# Patient Record
Sex: Female | Born: 1975 | Race: Black or African American | Hispanic: No | Smoking: Never smoker
Health system: Southern US, Community
[De-identification: ages and names within clinical notes are randomized; demographics above are authoritative.]

## PROBLEM LIST (undated history)

## (undated) DIAGNOSIS — Z789 Other specified health status: Secondary | ICD-10-CM

## (undated) HISTORY — DX: Other specified health status: Z78.9

---

## 1999-05-09 ENCOUNTER — Emergency Department (HOSPITAL_COMMUNITY): Admission: EM | Admit: 1999-05-09 | Discharge: 1999-05-09 | Payer: Self-pay | Admitting: Emergency Medicine

## 1999-08-28 ENCOUNTER — Other Ambulatory Visit: Admission: RE | Admit: 1999-08-28 | Discharge: 1999-08-28 | Payer: Self-pay | Admitting: Obstetrics

## 2000-02-27 ENCOUNTER — Inpatient Hospital Stay: Admission: AD | Admit: 2000-02-27 | Discharge: 2000-02-27 | Payer: Self-pay | Admitting: Obstetrics

## 2000-03-16 ENCOUNTER — Inpatient Hospital Stay (HOSPITAL_COMMUNITY): Admission: AD | Admit: 2000-03-16 | Discharge: 2000-03-18 | Payer: Self-pay | Admitting: Obstetrics

## 2000-03-31 ENCOUNTER — Encounter: Admission: RE | Admit: 2000-03-31 | Discharge: 2000-06-29 | Payer: Self-pay | Admitting: Obstetrics

## 2000-11-02 ENCOUNTER — Emergency Department (HOSPITAL_COMMUNITY): Admission: EM | Admit: 2000-11-02 | Discharge: 2000-11-02 | Payer: Self-pay | Admitting: *Deleted

## 2000-11-09 ENCOUNTER — Other Ambulatory Visit: Admission: RE | Admit: 2000-11-09 | Discharge: 2000-11-09 | Payer: Self-pay | Admitting: Obstetrics

## 2001-05-27 ENCOUNTER — Inpatient Hospital Stay (HOSPITAL_COMMUNITY): Admission: AD | Admit: 2001-05-27 | Discharge: 2001-05-27 | Payer: Self-pay | Admitting: Obstetrics

## 2001-07-05 ENCOUNTER — Inpatient Hospital Stay (HOSPITAL_COMMUNITY): Admission: AD | Admit: 2001-07-05 | Discharge: 2001-07-08 | Payer: Self-pay | Admitting: Obstetrics

## 2001-07-05 ENCOUNTER — Encounter (INDEPENDENT_AMBULATORY_CARE_PROVIDER_SITE_OTHER): Payer: Self-pay | Admitting: Specialist

## 2002-07-14 ENCOUNTER — Emergency Department (HOSPITAL_COMMUNITY): Admission: EM | Admit: 2002-07-14 | Discharge: 2002-07-14 | Payer: Self-pay | Admitting: Emergency Medicine

## 2004-01-21 ENCOUNTER — Emergency Department (HOSPITAL_COMMUNITY): Admission: EM | Admit: 2004-01-21 | Discharge: 2004-01-21 | Payer: Self-pay | Admitting: Emergency Medicine

## 2004-03-05 ENCOUNTER — Emergency Department (HOSPITAL_COMMUNITY): Admission: EM | Admit: 2004-03-05 | Discharge: 2004-03-06 | Payer: Self-pay | Admitting: Family Medicine

## 2004-04-01 ENCOUNTER — Emergency Department (HOSPITAL_COMMUNITY): Admission: EM | Admit: 2004-04-01 | Discharge: 2004-04-01 | Payer: Self-pay | Admitting: Family Medicine

## 2004-07-17 ENCOUNTER — Emergency Department (HOSPITAL_COMMUNITY): Admission: EM | Admit: 2004-07-17 | Discharge: 2004-07-17 | Payer: Self-pay | Admitting: Family Medicine

## 2005-01-07 ENCOUNTER — Emergency Department (HOSPITAL_COMMUNITY): Admission: EM | Admit: 2005-01-07 | Discharge: 2005-01-07 | Payer: Self-pay | Admitting: Emergency Medicine

## 2007-03-31 ENCOUNTER — Emergency Department (HOSPITAL_COMMUNITY): Admission: EM | Admit: 2007-03-31 | Discharge: 2007-03-31 | Payer: Self-pay | Admitting: Emergency Medicine

## 2007-05-03 ENCOUNTER — Emergency Department (HOSPITAL_COMMUNITY): Admission: EM | Admit: 2007-05-03 | Discharge: 2007-05-04 | Payer: Self-pay | Admitting: Emergency Medicine

## 2007-12-02 ENCOUNTER — Emergency Department (HOSPITAL_COMMUNITY): Admission: EM | Admit: 2007-12-02 | Discharge: 2007-12-02 | Payer: Self-pay | Admitting: Emergency Medicine

## 2009-06-01 ENCOUNTER — Emergency Department (HOSPITAL_COMMUNITY): Admission: EM | Admit: 2009-06-01 | Discharge: 2009-06-01 | Payer: Self-pay | Admitting: Emergency Medicine

## 2009-12-13 ENCOUNTER — Emergency Department (HOSPITAL_COMMUNITY): Admission: EM | Admit: 2009-12-13 | Discharge: 2009-12-13 | Payer: Self-pay | Admitting: Family Medicine

## 2009-12-20 ENCOUNTER — Emergency Department (HOSPITAL_COMMUNITY): Admission: EM | Admit: 2009-12-20 | Discharge: 2009-12-20 | Payer: Self-pay | Admitting: Family Medicine

## 2011-02-28 NOTE — H&P (Signed)
[facility]  Patient:    ARRA, CONNAUGHTON Visit Number: [id number] MRN: [id number]          Service Type: OBS Location: 910B 9159 01 Attending Physician:  Venita Sheffield Dictated by:   Kathreen Cosier, M.D. Admit Date:  07/05/2001                           History and Physical  HISTORY:                      The patient is a 35 year old, gravida 2, para 1-0-0-1, [facility] July 01, 2001 who was admitted for induction. She had a positive GBS which was treated with Cleocin. On admission, her cervix was 4 cm, 70%, vertex, and -2. The membranes were ruptured artificially and the fluid was clear. She was started on Pitocin stimulation and by 4:55 p.m. she was still at 4 cm, 90%, vertex, and -1. By 7:15 p.m. she was still 4 cm, 90%, vertex -1 and having late decelerations which did not stop with O2 administration, position change, and even when the Pitocin was discontinued, she still continued having late decelerations. She was given terbutaline 0.25 mg subcutaneously while awaiting the OR.  PHYSICAL EXAMINATION:  GENERAL:                      Well-developed female in labor.  HEENT:                        Negative.  LUNGS:                        Clear.  HEART:                        Regular rhythm, no murmurs or gallops.  ABDOMEN:                      Term size uterus. Estimated fetal weight 7 pounds 6 ounces. Fetal heart 159.  EXTREMITIES:                  Negative. Dictated by:   Kathreen Cosier, M.D. Attending Physician:  Venita Sheffield DD:  07/05/01 TD:  07/05/01 Job: [id number]

## 2011-02-28 NOTE — Op Note (Signed)
[facility]  Patient:    Bethany Reid, Bethany Reid Visit Number: [id number] MRN: [id number]          Service Type: OBS Location: 910B 9159 01 Attending Physician:  Venita Sheffield Dictated by:   Kathreen Cosier, M.D. Proc. Date: 07/05/01 Admit Date:  07/05/2001                             Operative Report  PREOPERATIVE DIAGNOSIS:       Nonreassuring fetal heart rate tracing.  POSTOPERATIVE DIAGNOSIS:  PROCEDURE:                    Primary low transverse cesarean section and                               tubal ligation.  SURGEON:                      Kathreen Cosier, M.D.  ANESTHESIA:                   Epidural.  DESCRIPTION OF PROCEDURE:     The patient was placed on the operating table in supine position. Abdomen was prepped and draped. The bladder was emptied with a Foley catheter. A transverse suprapubic incision was made and carried down to the rectus fascia. The fascia was cleanly incised the length of the incision. The rectus muscles were retracted laterally. The peritoneum was incised longitudinally. A transverse incision was made through the visceral peritoneum above the bladder and the bladder mobilized inferiorly. A transverse lower uterine incision was made and the fluid was clear. The patient delivered from the OP position, a female, Apgar 8 and 9 weighing 7 pounds 13 ounces. The team was in attendance. The placenta was anterior and removed manually and sent to pathology. The uterine cavity was cleaned with dry laps.  The uterine incision was closed in one layer with continuous suture of #1 chromic. Hemostasis was satisfactory. Bladder flap was reattached with 2-0 chromic. The uterus was well contracted. Tubes and ovaries were normal. The right tube was grasped in the mid portion with the Babcock clamp. A 0 plain suture was placed in the mesosalpinx below the portion of the tube and then clamped. Approximately 1  inch of tube was transected. Procedure done in a similar fashion on the other side. Probes were removed. The abdomen was closed in layers; the peritoneum with continuous suture of 0 chromic, the fascia with continuous suture of 0 Dexon, and the skin closed with subcuticular suture of 3-0 plain. The patient tolerated the procedure well and was taken to the recovery room in good condition. Dictated by:   Kathreen Cosier, M.D. Attending Physician:  Venita Sheffield DD:  07/05/01 TD:  07/05/01 Job: [id number]

## 2011-02-28 NOTE — Discharge Summary (Signed)
[facility]  Patient:    Bethany Reid, JOKERST Visit Number: [id number] MRN: [id number]          Service Type: OBS Location: 910A 9108 01 Attending Physician:  Venita Sheffield Dictated by:   Kathreen Cosier, M.D. Admit Date:  07/05/2001 Discharge Date: 07/08/2001                             Discharge Summary  HISTORY OF PRESENT ILLNESS:  The patient is a 35 year old Gravida 2, Para 1-0-0-1, whose EDC was July 01, 2001.  HOSPITAL COURSE:  She was admitted for induction.  Her cervix was 4 cm, 70% at -2 station on admission.  The membranes were ruptured artificially, the fluid was clear.  She had a positive GBS, she is allergic to PENICILLIN, started on Cleocin 900 mg IV every 8 hours.  The estimated fetal weight was 7.6 ounces. The patient developed persistent late decelerations, which did not resolve with position change or oxygen administration and underwent a primary low transverse cesarean section and tubal ligation.  She had a female, Apgar 8/9, weighing 7 pounds 13 ounces from the OP position.  The placenta was sent to pathology.  Postoperatively, she did well, her hemoglobin was 10.4.  She was discharged home on the third postoperative day, ambulatory, on a regular diet on Tylox 1 p.o. q.4h., to see me in six weeks.  DISCHARGE DIAGNOSIS:  Status post primary low-transverse cesarean section for a nonreassuring fetal heart rate tracing.  Multiparity, tubal ligation performed. Dictated by:   Kathreen Cosier, M.D. Attending Physician:  Venita Sheffield DD:  07/08/01 TD:  07/08/01 Job: [id number]

## 2011-12-24 ENCOUNTER — Other Ambulatory Visit: Payer: Self-pay | Admitting: Nurse Practitioner

## 2011-12-24 DIAGNOSIS — N926 Irregular menstruation, unspecified: Secondary | ICD-10-CM

## 2011-12-31 ENCOUNTER — Ambulatory Visit
Admission: RE | Admit: 2011-12-31 | Discharge: 2011-12-31 | Disposition: A | Discharge status: Home / Self Care | Payer: 59 | Source: Ambulatory Visit | Attending: Nurse Practitioner | Admitting: Nurse Practitioner

## 2011-12-31 DIAGNOSIS — N926 Irregular menstruation, unspecified: Secondary | ICD-10-CM

## 2013-09-18 ENCOUNTER — Encounter (HOSPITAL_COMMUNITY): Payer: Self-pay | Admitting: Emergency Medicine

## 2013-09-18 ENCOUNTER — Emergency Department (HOSPITAL_COMMUNITY)
Admission: EM | Admit: 2013-09-18 | Discharge: 2013-09-18 | Disposition: A | Discharge status: Home / Self Care | Payer: 59 | Attending: Emergency Medicine | Admitting: Emergency Medicine

## 2013-09-18 DIAGNOSIS — R509 Fever, unspecified: Secondary | ICD-10-CM | POA: Insufficient documentation

## 2013-09-18 DIAGNOSIS — R51 Headache: Secondary | ICD-10-CM | POA: Insufficient documentation

## 2013-09-18 DIAGNOSIS — Z880 Allergy status to penicillin: Secondary | ICD-10-CM | POA: Insufficient documentation

## 2013-09-18 DIAGNOSIS — N12 Tubulo-interstitial nephritis, not specified as acute or chronic: Secondary | ICD-10-CM | POA: Insufficient documentation

## 2013-09-18 DIAGNOSIS — Z3202 Encounter for pregnancy test, result negative: Secondary | ICD-10-CM | POA: Insufficient documentation

## 2013-09-18 DIAGNOSIS — R110 Nausea: Secondary | ICD-10-CM | POA: Insufficient documentation

## 2013-09-18 LAB — BASIC METABOLIC PANEL
BUN: 8 mg/dL (ref 6–23)
CO2: 23 mEq/L (ref 19–32)
Chloride: 104 mEq/L (ref 96–112)
Creatinine, Ser: 0.83 mg/dL (ref 0.50–1.10)

## 2013-09-18 LAB — URINALYSIS, ROUTINE W REFLEX MICROSCOPIC
Glucose, UA: NEGATIVE mg/dL
pH: 6.5 (ref 5.0–8.0)

## 2013-09-18 LAB — CBC WITH DIFFERENTIAL/PLATELET
HCT: 33.2 % — ABNORMAL LOW (ref 36.0–46.0)
Hemoglobin: 11.3 g/dL — ABNORMAL LOW (ref 12.0–15.0)
Lymphocytes Relative: 8 % — ABNORMAL LOW (ref 12–46)
Monocytes Absolute: 0.9 10*3/uL (ref 0.1–1.0)
Monocytes Relative: 8 % (ref 3–12)
Neutro Abs: 9.3 10*3/uL — ABNORMAL HIGH (ref 1.7–7.7)
WBC: 11.1 10*3/uL — ABNORMAL HIGH (ref 4.0–10.5)

## 2013-09-18 LAB — URINE MICROSCOPIC-ADD ON

## 2013-09-18 LAB — POCT PREGNANCY, URINE: Preg Test, Ur: NEGATIVE

## 2013-09-18 MED ORDER — SODIUM CHLORIDE 0.9 % IV BOLUS (SEPSIS)
1000.0000 mL | Freq: Once | INTRAVENOUS | Status: AC
  Administered 2013-09-18: 1000 mL via INTRAVENOUS

## 2013-09-18 MED ORDER — HYDROCODONE-ACETAMINOPHEN 5-325 MG PO TABS: 2.0000 | ORAL_TABLET | ORAL | Status: DC | PRN

## 2013-09-18 MED ORDER — ONDANSETRON HCL 4 MG/2ML IJ SOLN
4.0000 mg | Freq: Once | INTRAMUSCULAR | Status: AC
  Administered 2013-09-18: 4 mg via INTRAVENOUS
  Filled 2013-09-18: qty 2

## 2013-09-18 MED ORDER — ACETAMINOPHEN 325 MG PO TABS
650.0000 mg | ORAL_TABLET | Freq: Once | ORAL | Status: AC
  Administered 2013-09-18: 650 mg via ORAL
  Filled 2013-09-18: qty 2

## 2013-09-18 MED ORDER — ACETAMINOPHEN 650 MG RE SUPP
650.0000 mg | Freq: Once | RECTAL | Status: DC
  Filled 2013-09-18: qty 1

## 2013-09-18 MED ORDER — CIPROFLOXACIN HCL 500 MG PO TABS: 500.0000 mg | ORAL_TABLET | Freq: Two times a day (BID) | ORAL | Status: DC

## 2013-09-18 MED ORDER — ONDANSETRON 4 MG PO TBDP: 4.0000 mg | ORAL_TABLET | Freq: Three times a day (TID) | ORAL | Status: DC | PRN

## 2013-09-18 MED ORDER — CIPROFLOXACIN IN D5W 400 MG/200ML IV SOLN
400.0000 mg | Freq: Once | INTRAVENOUS | Status: AC
  Administered 2013-09-18: 400 mg via INTRAVENOUS
  Filled 2013-09-18: qty 200

## 2013-09-18 MED ORDER — MORPHINE SULFATE 4 MG/ML IJ SOLN
4.0000 mg | Freq: Once | INTRAMUSCULAR | Status: AC
  Administered 2013-09-18: 4 mg via INTRAVENOUS
  Filled 2013-09-18: qty 1

## 2013-09-18 NOTE — ED Notes (Signed)
Patient will let me know when she has to urinate

## 2013-09-18 NOTE — ED Provider Notes (Signed)
CSN: [id number]     Arrival date & time 09/18/13  1478 History   First MD Initiated Contact with Patient 09/18/13 1004     Chief Complaint  Patient presents with  . Headache  . Abdominal Pain  . Flank Pain  . Fever   (Consider location/radiation/quality/duration/timing/severity/associated sxs/prior Treatment) HPI Comments: Patient is a 37 year old female with no significant past medical history who presents with left flank pain. The pain is located in her left flank and radiates to her back. The pain is described as aching and severe. The pain started gradually and progressively worsened since the onset. No alleviating/aggravating factors. The patient has tried nothing for symptoms without relief. Associated symptoms include headache, nausea and fever. Patient denies vomiting, diarrhea, chest pain, SOB, dysuria, constipation, abnormal vaginal bleeding/discharge. LMP last week.      History reviewed. No pertinent past medical history. History reviewed. No pertinent past surgical history. No family history on file. History  Substance Use Topics  . Smoking status: Not on file  . Smokeless tobacco: Not on file  . Alcohol Use: Not on file   OB History   Grav Para Term Preterm Abortions TAB SAB Ect Mult Living                 Review of Systems  Constitutional: Positive for fever. Negative for chills and fatigue.  HENT: Negative for trouble swallowing.   Eyes: Negative for visual disturbance.  Respiratory: Negative for shortness of breath.   Cardiovascular: Negative for chest pain and palpitations.  Gastrointestinal: Positive for abdominal pain. Negative for nausea, vomiting and diarrhea.  Genitourinary: Positive for flank pain. Negative for dysuria and difficulty urinating.  Musculoskeletal: Negative for arthralgias and neck pain.  Skin: Negative for color change.  Neurological: Negative for dizziness and weakness.  Psychiatric/Behavioral: Negative for dysphoric mood.     Allergies  Penicillins  Home Medications   Current Outpatient Rx  Name  Route  Sig  Dispense  Refill  . ibuprofen (ADVIL,MOTRIN) 200 MG tablet   Oral   Take 1,200 mg by mouth every 6 (six) hours as needed.          BP 118/79  Pulse 123  Temp(Src) 102.8 F (39.3 C) (Oral)  Resp 14  SpO2 98% Physical Exam  Nursing note and vitals reviewed. Constitutional: She is oriented to person, place, and time. She appears well-developed and well-nourished. No distress.  HENT:  Head: Normocephalic and atraumatic.  Eyes: Conjunctivae and EOM are normal.  Neck: Normal range of motion.  Cardiovascular: Normal rate and regular rhythm.  Exam reveals no gallop and no friction rub.   No murmur heard. Pulmonary/Chest: Effort normal and breath sounds normal. She has no wheezes. She has no rales. She exhibits no tenderness.  Abdominal: Soft. She exhibits no distension. There is no tenderness. There is no rebound and no guarding.  Genitourinary:  Left CVA tenderness to palpation.   Musculoskeletal: Normal range of motion.  Neurological: She is alert and oriented to person, place, and time. Coordination normal.  Speech is goal-oriented. Moves limbs without ataxia.   Skin: Skin is warm and dry.  Psychiatric: She has a normal mood and affect. Her behavior is normal.    ED Course  Procedures (including critical care time) Labs Review Labs Reviewed  URINALYSIS, ROUTINE W REFLEX MICROSCOPIC - Abnormal; Notable for the following:    APPearance CLOUDY (*)    Hgb urine dipstick MODERATE (*)    Leukocytes, UA MODERATE (*)  All other components within normal limits  CBC WITH DIFFERENTIAL - Abnormal; Notable for the following:    WBC 11.1 (*)    RBC 3.84 (*)    Hemoglobin 11.3 (*)    HCT 33.2 (*)    Neutrophils Relative % 83 (*)    Neutro Abs 9.3 (*)    Lymphocytes Relative 8 (*)    All other components within normal limits  BASIC METABOLIC PANEL - Abnormal; Notable for the following:     GFR calc non Af Amer 90 (*)    All other components within normal limits  URINE MICROSCOPIC-ADD ON - Abnormal; Notable for the following:    Squamous Epithelial / LPF MANY (*)    Bacteria, UA MANY (*)    All other components within normal limits  URINE CULTURE  POCT PREGNANCY, URINE   Imaging Review No results found.  EKG Interpretation   None       MDM   1. Pyelonephritis     12:14 PM Labs show mild elevation of WBC. Urinalysis shows UTI. Patient likely has pyelonephritis. Patient given morphine and zofran for symptoms. Patient will have Cipro IV for infection. Patient also given tylenol for fever.     Emilia Beck, PA-C 09/18/13 1557

## 2013-09-18 NOTE — ED Notes (Signed)
Pt has lt flank and abd pain that radates to back  started on fri, denies any urinary sx. States LMP was last week and normal. Headache also sharp pain took aleve with no relief. ALert x4, no emesis but does have nausea.

## 2013-09-18 NOTE — ED Provider Notes (Signed)
Medical screening examination/treatment/procedure(s) were performed by non-physician practitioner and as supervising physician I was immediately available for consultation/collaboration.  EKG Interpretation   None         Rolan Bucco, MD 09/18/13 1600

## 2013-09-20 LAB — URINE CULTURE: Colony Count: >=100000

## 2013-09-23 ENCOUNTER — Telehealth (HOSPITAL_COMMUNITY): Payer: Self-pay | Admitting: Emergency Medicine

## 2013-09-23 NOTE — ED Notes (Signed)
Post ED Visit - Positive Culture Follow-up  Culture report reviewed by antimicrobial stewardship pharmacist: []  Wes Dulaney, Pharm.D., BCPS [x]  Celedonio Miyamoto, [address].D., BCPS []  Georgina Pillion, Pharm.D., BCPS []  [place], [address].D., BCPS, AAHIVP []  Estella Husk, Pharm.D., BCPS, AAHIVP  Positive urine culture Treated with Cipro, organism sensitive to the same and no further patient follow-up is required at this time.  Kylie A Holland 09/23/2013, 10:00 AM

## 2013-10-27 IMAGING — US US ABDOMEN COMPLETE
1 series · 14 of 25 positions shown · non-contrast
Comparison: None

CLINICAL DATA: Irregular menstruation.

COMPLETE ABDOMINAL ULTRASOUND

[Series 1: us abdomen complete · 0.29mm/px · 14 of 71 slices shown]
[im 1/71]
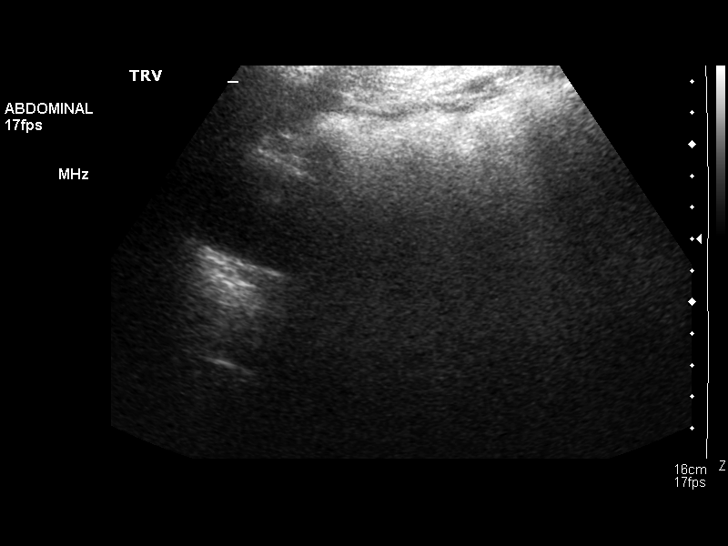
[im 6/71]
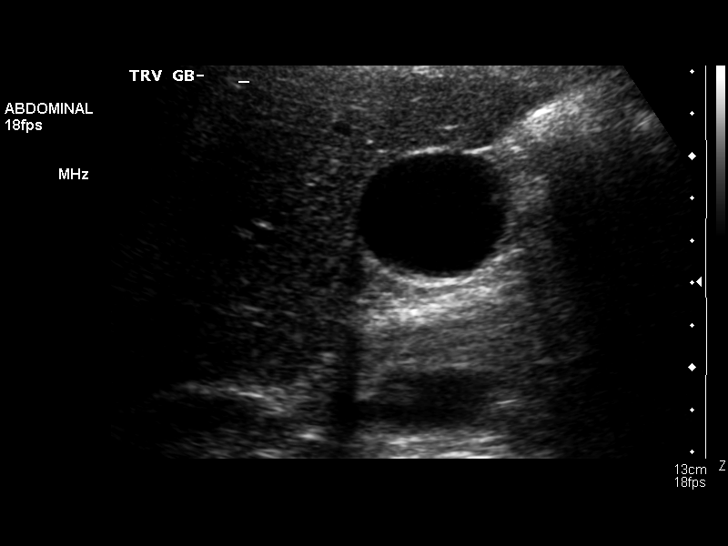
[im 12/71]
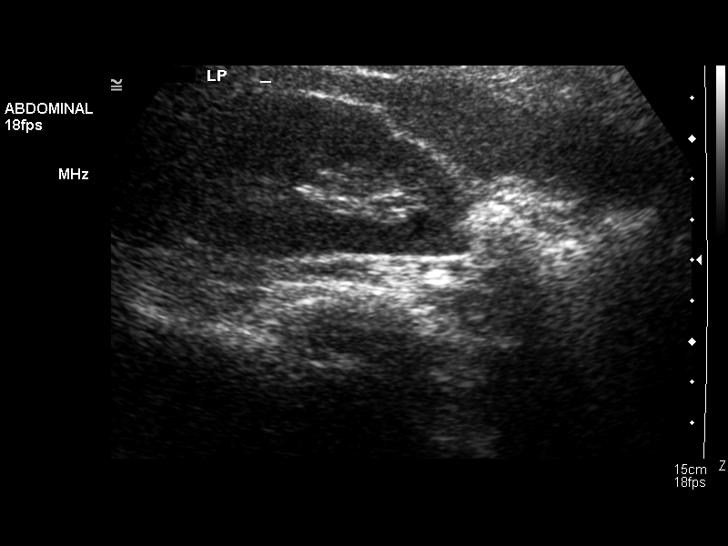
[im 18/71]
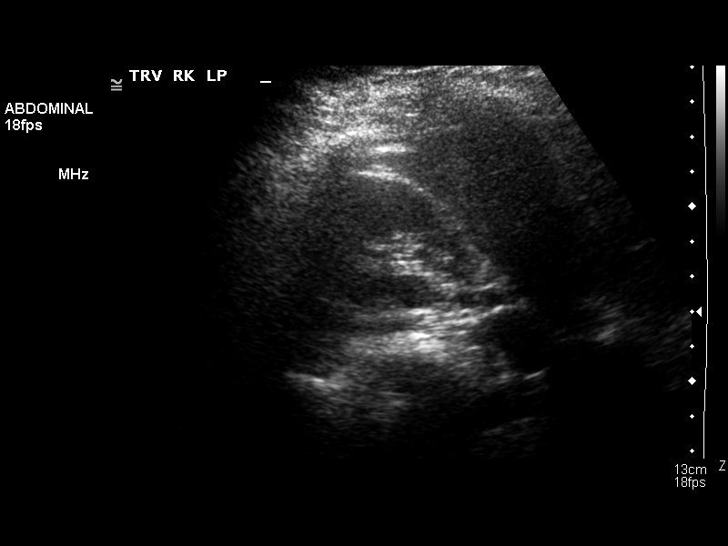
[im 24/71]
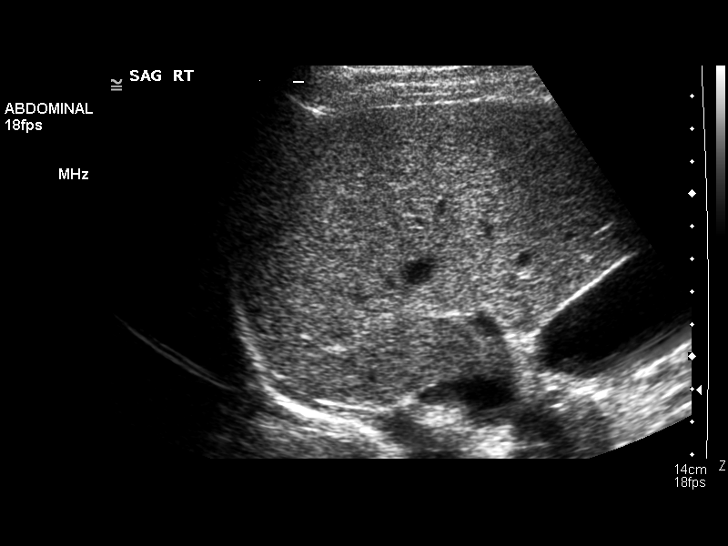
[im 27/71]
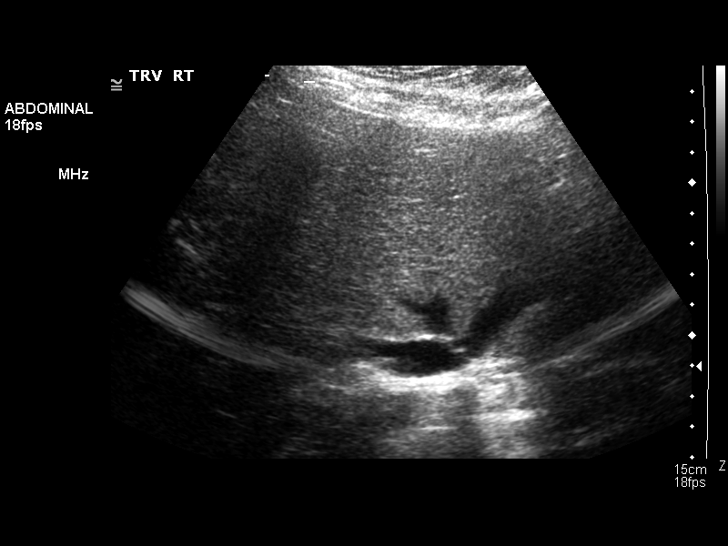
[im 33/71]
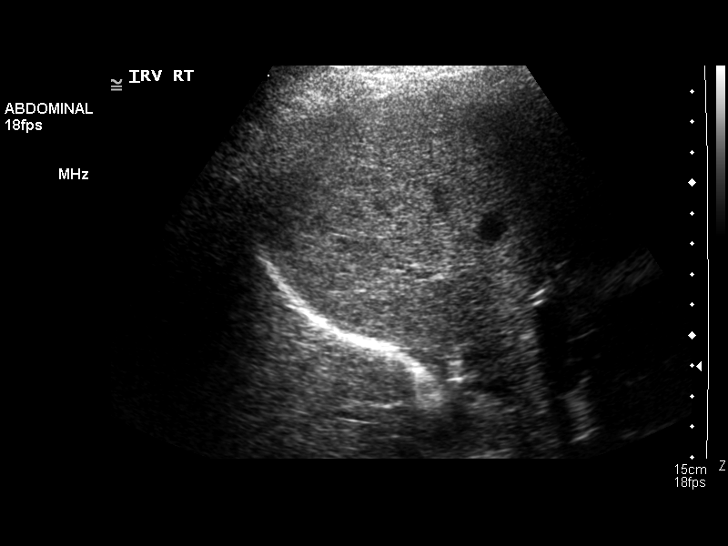
[im 38/71]
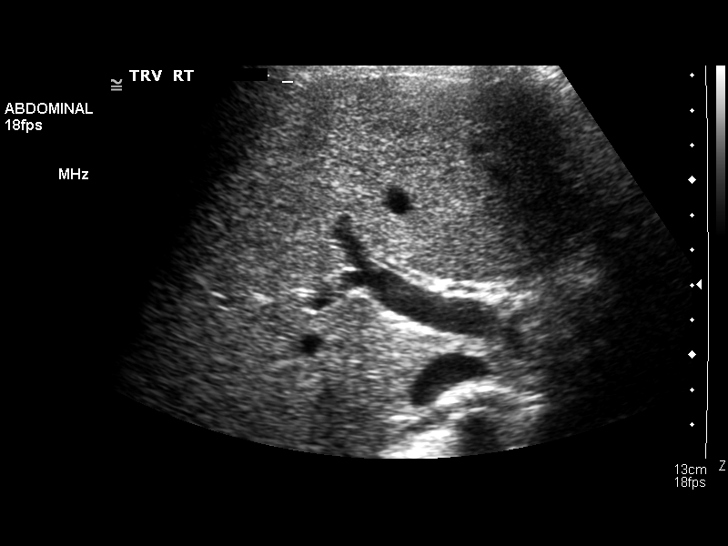
[im 44/71]
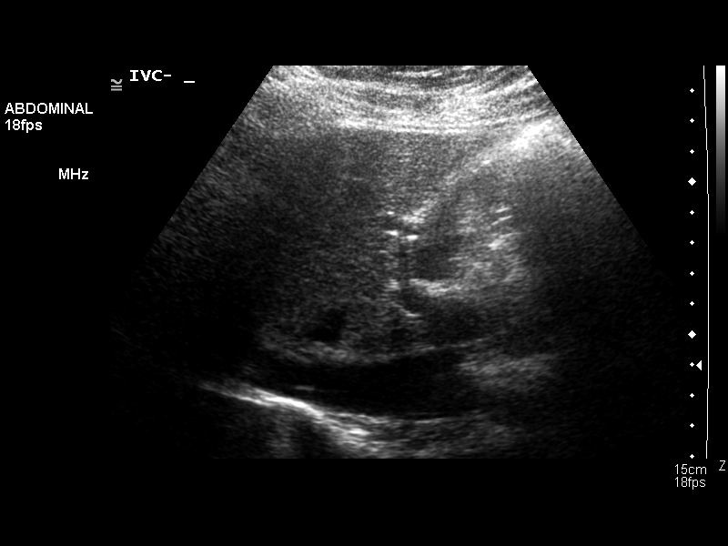
[im 47/71]
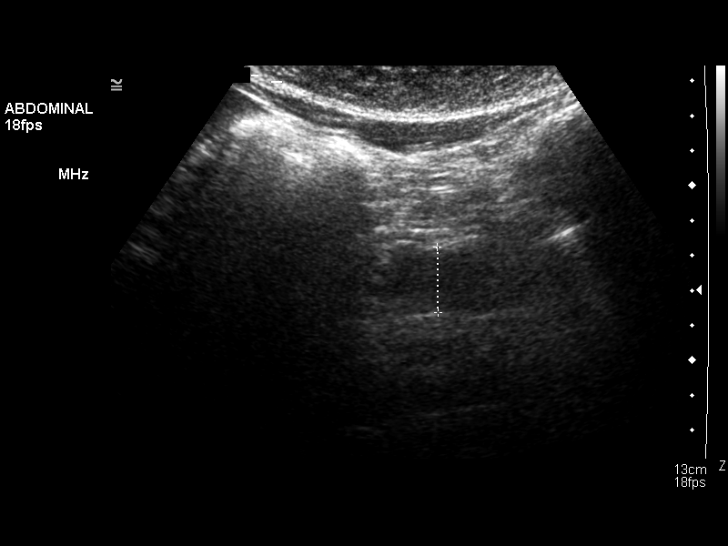
[im 53/71]
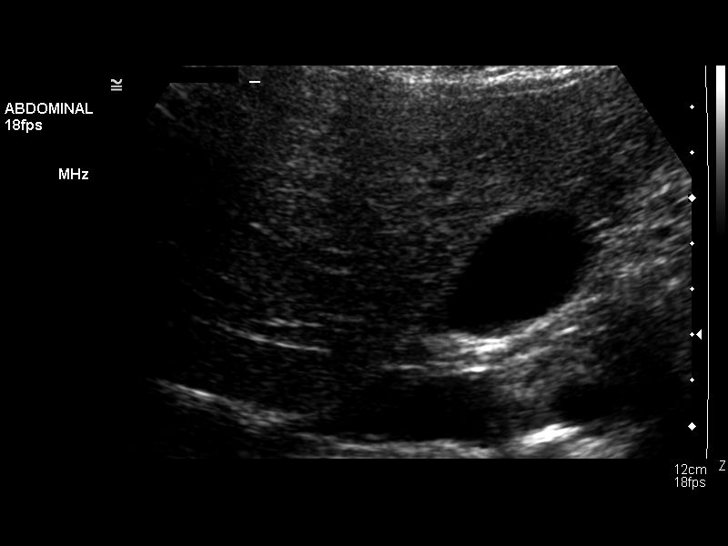
[im 59/71]
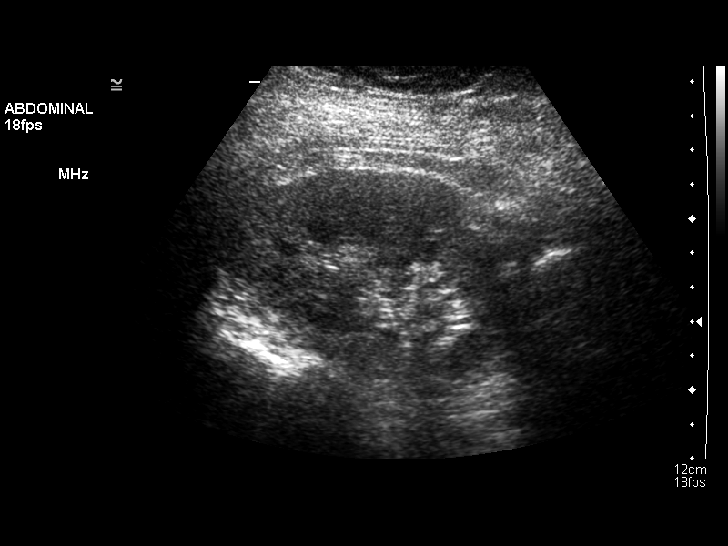
[im 65/71]
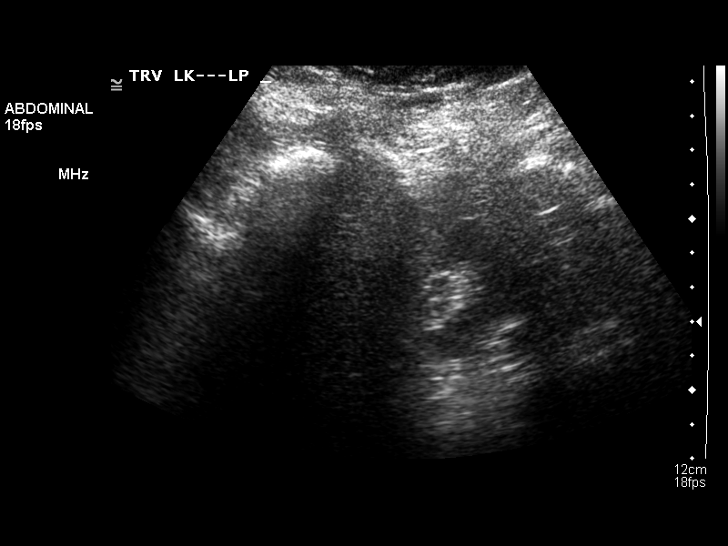
[im 71/71]
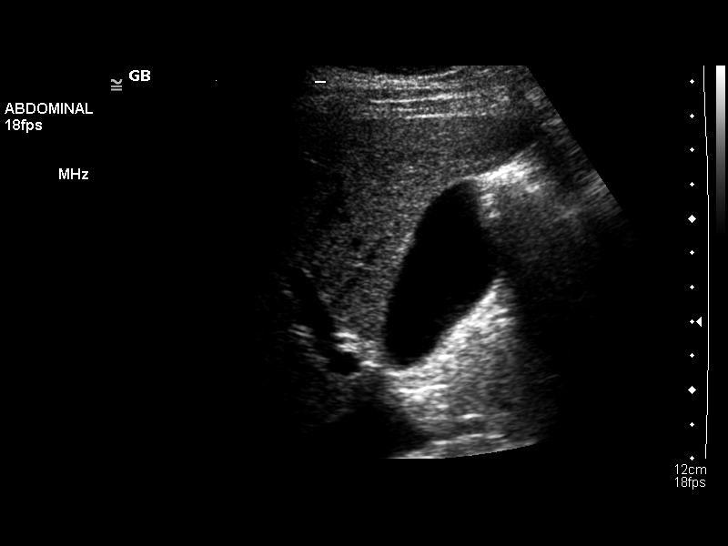

[14 of 25 positions shown; findings below may reference images not displayed]

FINDINGS: Gallbladder: Well distended without wall thickening, stones or
pericholecystic fluid. Negative sonographic Murphy's sign.

Common bile duct:   Normal in caliber without filling defects.

Liver:  The hepatic echogenicity is minimally heterogeneous without
focal abnormality.

IVC:  Visualized portions appear unremarkable.

Pancreas:  The pancreas is obscured by bowel gas and is not
visualized.

Spleen:  Visualized portions appear unremarkable.

Right Kidney:   The renal cortical thickness and echogenicity are
preserved.  There is no hydronephrosis or focal abnormality. Renal
length is 10.3 cm.

Left Kidney:   The renal cortical thickness and echogenicity are
preserved.  There is no hydronephrosis or focal abnormality. Renal
length is 9.7 cm.

Abdominal aorta:  Visualized portions appear unremarkable.  The
proximal aorta is obscured by bowel gas.
IMPRESSION: 1.  No acute abdominal findings identified.
2.  The pancreas and proximal aorta are obscured by bowel gas.

## 2015-04-22 ENCOUNTER — Emergency Department (HOSPITAL_COMMUNITY)
Admission: EM | Admit: 2015-04-22 | Discharge: 2015-04-22 | Disposition: A | Discharge status: Home / Self Care | Payer: No Typology Code available for payment source | Attending: Emergency Medicine | Admitting: Emergency Medicine

## 2015-04-22 ENCOUNTER — Encounter (HOSPITAL_COMMUNITY): Payer: Self-pay | Admitting: Emergency Medicine

## 2015-04-22 ENCOUNTER — Emergency Department (HOSPITAL_COMMUNITY): Payer: No Typology Code available for payment source

## 2015-04-22 DIAGNOSIS — R05 Cough: Secondary | ICD-10-CM | POA: Insufficient documentation

## 2015-04-22 DIAGNOSIS — R42 Dizziness and giddiness: Secondary | ICD-10-CM | POA: Insufficient documentation

## 2015-04-22 DIAGNOSIS — Z3202 Encounter for pregnancy test, result negative: Secondary | ICD-10-CM | POA: Insufficient documentation

## 2015-04-22 DIAGNOSIS — Z880 Allergy status to penicillin: Secondary | ICD-10-CM | POA: Insufficient documentation

## 2015-04-22 DIAGNOSIS — R059 Cough, unspecified: Secondary | ICD-10-CM

## 2015-04-22 DIAGNOSIS — R0981 Nasal congestion: Secondary | ICD-10-CM

## 2015-04-22 LAB — I-STAT CHEM 8, ED
BUN: 9 mg/dL (ref 6–20)
CALCIUM ION: 1.18 mmol/L (ref 1.12–1.23)
CHLORIDE: 104 mmol/L (ref 101–111)
Creatinine, Ser: 0.9 mg/dL (ref 0.44–1.00)
Glucose, Bld: 97 mg/dL (ref 65–99)
HEMATOCRIT: 44 % (ref 36.0–46.0)
Hemoglobin: 15 g/dL (ref 12.0–15.0)
Potassium: 4.1 mmol/L (ref 3.5–5.1)
SODIUM: 139 mmol/L (ref 135–145)
TCO2: 26 mmol/L (ref 0–100)

## 2015-04-22 LAB — POC URINE PREG, ED: PREG TEST UR: NEGATIVE

## 2015-04-22 MED ORDER — SODIUM CHLORIDE 0.9 % IV BOLUS (SEPSIS)
1000.0000 mL | INTRAVENOUS | Status: AC
  Administered 2015-04-22: 1000 mL via INTRAVENOUS

## 2015-04-22 MED ORDER — FLUTICASONE PROPIONATE 50 MCG/ACT NA SUSP: 2.0000 | Freq: Every day | NASAL | Status: DC

## 2015-04-22 MED ORDER — LORATADINE 10 MG PO TABS: 10.0000 mg | ORAL_TABLET | Freq: Every day | ORAL | Status: DC | PRN

## 2015-04-22 NOTE — ED Provider Notes (Signed)
CSN: [id number]     Arrival date & time 04/22/15  1251 History   First MD Initiated Contact with Patient 04/22/15 1535     Chief Complaint  Patient presents with  . Cough  . Nasal Congestion  . Dizziness     (Consider location/radiation/quality/duration/timing/severity/associated sxs/prior Treatment) Patient is a 39 y.o. female presenting with cough and dizziness. The history is provided by the patient.  Cough Cough characteristics:  Productive Sputum characteristics:  Yellow Severity:  Mild Onset quality:  Gradual Duration:  4 weeks Timing:  Constant Progression:  Unchanged Chronicity:  New Context: upper respiratory infection   Relieved by:  Nothing Worsened by:  Nothing tried Ineffective treatments: mucinex. Associated symptoms: no chest pain, no fever, no headaches, no shortness of breath and no sore throat   Dizziness Quality:  Head spinning Severity:  Moderate Onset quality:  Sudden Duration:  1 day Timing:  Intermittent Progression:  Unchanged Chronicity:  New Context: physical activity   Relieved by:  Lying down Worsened by:  Movement Ineffective treatments:  None tried Associated symptoms: no chest pain, no diarrhea, no headaches, no nausea, no shortness of breath and no vomiting     History reviewed. No pertinent past medical history. History reviewed. No pertinent past surgical history. History reviewed. No pertinent family history. History  Substance Use Topics  . Smoking status: Never Smoker   . Smokeless tobacco: Not on file  . Alcohol Use: No   OB History    No data available     Review of Systems  Constitutional: Negative for fever and fatigue.  HENT: Positive for congestion. Negative for drooling and sore throat.   Eyes: Negative for pain.  Respiratory: Positive for cough. Negative for shortness of breath.   Cardiovascular: Negative for chest pain.  Gastrointestinal: Negative for nausea, vomiting, abdominal pain and diarrhea.   Genitourinary: Negative for dysuria and hematuria.  Musculoskeletal: Negative for back pain, gait problem and neck pain.  Skin: Negative for color change.  Neurological: Positive for dizziness. Negative for headaches.  Hematological: Negative for adenopathy.  Psychiatric/Behavioral: Negative for behavioral problems.  All other systems reviewed and are negative.     Allergies  Penicillins  Home Medications   Prior to Admission medications   Medication Sig Start Date End Date Taking? Authorizing Provider  ciprofloxacin (CIPRO) 500 MG tablet Take 1 tablet (500 mg total) by mouth 2 (two) times daily. 09/18/13   Emilia BeckKaitlyn Szekalski, PA-C  HYDROcodone-acetaminophen (NORCO/VICODIN) 5-325 MG per tablet Take 2 tablets by mouth every 4 (four) hours as needed. 09/18/13   Kaitlyn Szekalski, PA-C  ibuprofen (ADVIL,MOTRIN) 200 MG tablet Take 1,200 mg by mouth every 6 (six) hours as needed.    Historical Provider, MD  ondansetron (ZOFRAN ODT) 4 MG disintegrating tablet Take 1 tablet (4 mg total) by mouth every 8 (eight) hours as needed for nausea or vomiting. 09/18/13   Kaitlyn Szekalski, PA-C   BP 135/74 mmHg  Pulse 85  Temp(Src) 97.9 F (36.6 C) (Oral)  Resp 16  SpO2 100%  LMP 04/09/2015 (Approximate) Physical Exam  Constitutional: She is oriented to person, place, and time. She appears well-developed and well-nourished.  HENT:  Head: Normocephalic.  Mouth/Throat: Oropharynx is clear and moist. No oropharyngeal exudate.  Normal appearing tm's bilaterally.   Eyes: Conjunctivae and EOM are normal. Pupils are equal, round, and reactive to light.  Neck: Normal range of motion. Neck supple.  Cardiovascular: Normal rate, regular rhythm, normal heart sounds and intact distal pulses.  Exam reveals no  gallop and no friction rub.   No murmur heard. Pulmonary/Chest: Effort normal and breath sounds normal. No respiratory distress. She has no wheezes.  Abdominal: Soft. Bowel sounds are normal. There is  no tenderness. There is no rebound and no guarding.  Musculoskeletal: Normal range of motion. She exhibits no edema or tenderness.  Neurological: She is alert and oriented to person, place, and time.  alert, oriented x3 speech: normal in context and clarity memory: intact grossly cranial nerves II-XII: intact motor strength: full proximally and distally no involuntary movements or tremors sensation: intact to light touch diffusely  cerebellar: finger-to-nose and heel-to-shin intact gait: normal forwards and backwards  Skin: Skin is warm and dry.  Psychiatric: She has a normal mood and affect. Her behavior is normal.  Nursing note and vitals reviewed.   ED Course  Procedures (including critical care time) Labs Review Labs Reviewed  I-STAT CHEM 8, ED  POC URINE PREG, ED    Imaging Review Dg Chest 2 View  04/22/2015   CLINICAL DATA:  Three-week history of cough and congestion.  EXAM: CHEST  2 VIEW  COMPARISON:  12/02/2007  FINDINGS: The cardiac silhouette, mediastinal and hilar contours are within normal limits and stable. The lungs are clear. No pleural effusion. The bony thorax is intact.  IMPRESSION: No acute cardiopulmonary findings.   Electronically Signed   By: Rudie Meyer M.D.   On: 04/22/2015 16:16     EKG Interpretation   Date/Time:  Sunday April 22 2015 16:17:44 EDT Ventricular Rate:  72 PR Interval:  129 QRS Duration: 88 QT Interval:  386 QTC Calculation: 422 R Axis:   69 Text Interpretation:  Sinus rhythm Baseline wander in lead(s) II III aVF  V1 V2 V4 V5 Confirmed by Tyanne Derocher  MD, Brunette Lavalle ([id number]) on 04/22/2015 5:23:52  PM      MDM   Final diagnoses:  Cough  Dizziness  Nasal congestion    3:59 PM 39 y.o. female pw cough, cong x 1 mos. Denies fevers. Has had several episodes of sudden onset dizziness since yesterday evening. Sx last about 1 min and go away. 1-2 episodes today. Seems to be related to movement. Currently asx. Normal neuro exam. Likely  peripheral cause of dizziness and related to ongoing URI.   5:59 PM: I interpreted/reviewed the labs and/or imaging which were non-contributory.  Pt remains asx. Will tx nasal congestion.  I have discussed the diagnosis/risks/treatment options with the patient and believe the pt to be eligible for discharge home to follow-up with her pcp as needed. We also discussed returning to the ED immediately if new or worsening sx occur. We discussed the sx which are most concerning (e.g., worsening dizziness, fever, HA) that necessitate immediate return. Medications administered to the patient during their visit and any new prescriptions provided to the patient are listed below.  Medications given during this visit Medications  sodium chloride 0.9 % bolus 1,000 mL (1,000 mLs Intravenous New Bag/Given 04/22/15 1617)    New Prescriptions   FLUTICASONE (FLONASE) 50 MCG/ACT NASAL SPRAY    Place 2 sprays into both nostrils daily.   LORATADINE (CLARITIN) 10 MG TABLET    Take 1 tablet (10 mg total) by mouth daily as needed for allergies or rhinitis.     Purvis Sheffield, MD 04/22/15 1759

## 2015-04-22 NOTE — ED Notes (Signed)
Pt states that she has been having cough, nasal congestion x 3 wks.  States that last night, she had one episode of dizziness and nausea.  No vomiting.  Denies dizziness now.

## 2015-04-22 NOTE — ED Notes (Signed)
Pt made aware of need for Urine specimen

## 2017-04-06 DIAGNOSIS — B9789 Other viral agents as the cause of diseases classified elsewhere: Secondary | ICD-10-CM | POA: Insufficient documentation

## 2017-04-06 DIAGNOSIS — J069 Acute upper respiratory infection, unspecified: Secondary | ICD-10-CM | POA: Insufficient documentation

## 2017-04-07 ENCOUNTER — Emergency Department (HOSPITAL_COMMUNITY)
Admission: EM | Admit: 2017-04-07 | Discharge: 2017-04-07 | Disposition: A | Discharge status: Home / Self Care | Payer: Self-pay | Attending: Emergency Medicine | Admitting: Emergency Medicine

## 2017-04-07 ENCOUNTER — Encounter (HOSPITAL_COMMUNITY): Payer: Self-pay

## 2017-04-07 DIAGNOSIS — J069 Acute upper respiratory infection, unspecified: Secondary | ICD-10-CM

## 2017-04-07 DIAGNOSIS — B9789 Other viral agents as the cause of diseases classified elsewhere: Secondary | ICD-10-CM

## 2017-04-07 LAB — RAPID STREP SCREEN (MED CTR MEBANE ONLY): Streptococcus, Group A Screen (Direct): NEGATIVE

## 2017-04-07 MED ORDER — PREDNISONE 20 MG PO TABS
60.0000 mg | ORAL_TABLET | Freq: Once | ORAL | Status: AC
  Administered 2017-04-07: 60 mg via ORAL
  Filled 2017-04-07: qty 3

## 2017-04-07 MED ORDER — BENZONATATE 100 MG PO CAPS: 100.0000 mg | ORAL_CAPSULE | Freq: Three times a day (TID) | ORAL | 0 refills | Status: DC

## 2017-04-07 NOTE — ED Provider Notes (Signed)
[facility] Provider Note   CSN: [id number] Arrival date & time: 04/06/17  2158  By signing my name below, I, Phillips ClimesFabiola de Louis, attest that this documentation has been prepared under the direction and in the presence of Pollina, Canary BrimChristopher J, * . Electronically Signed: Phillips ClimesFabiola de Louis, Scribe. 04/07/2017. 1:52 AM.  History   Chief Complaint Chief Complaint  Patient presents with  . Sore Throat   HPI Comments Bethany Reid is a 41 y.o. female with no reported PMHx, who presents to the Emergency Department with complaints of a sore throat x3 days.  No trouble swallowing.  Associated cough and headache.  General malaise.  Sx have been constant since onset, currently rated a 7/10 in severity.  Pt denies experiencing any other acute sx, including nausea, vomiting, fever or chills.   The history is provided by the patient. No language interpreter was used.    History reviewed. No pertinent past medical history.  There are no active problems to display for this patient.   History reviewed. No pertinent surgical history.  OB History    No data available       Home Medications    Prior to Admission medications   Medication Sig Start Date End Date Taking? Authorizing Provider  benzonatate (TESSALON) 100 MG capsule Take 1 capsule (100 mg total) by mouth every 8 (eight) hours. 04/07/17   Gilda CreasePollina, Christopher J, MD    Family History History reviewed. No pertinent family history.  Social History Social History  Substance Use Topics  . Smoking status: Never Smoker  . Smokeless tobacco: Never Used  . Alcohol use No     Allergies   Patient has no allergy information on record.   Review of Systems Review of Systems  Constitutional: Positive for fatigue. Negative for chills and fever.  HENT: Positive for sore throat. Negative for trouble swallowing.   Respiratory: Positive for cough. Negative for shortness of breath.   Cardiovascular: Negative for chest pain.    Gastrointestinal: Negative for abdominal pain, nausea and vomiting.  Musculoskeletal: Negative for arthralgias.  Neurological: Positive for headaches. Negative for weakness and numbness.   Physical Exam Updated Vital Signs BP 120/79 (BP Location: Left Arm)   Pulse 82   Temp 98.8 F (37.1 C) (Oral)   Resp 18   Ht 5\' 3"  (1.6 m)   Wt 96.2 kg (212 lb)   LMP 03/31/2017   SpO2 100%   BMI 37.55 kg/m   Physical Exam  Constitutional: She is oriented to person, place, and time. She appears well-developed and well-nourished. No distress.  HENT:  Head: Normocephalic and atraumatic.  Right Ear: Hearing normal.  Left Ear: Hearing normal.  Nose: Nose normal.  Mouth/Throat: Oropharynx is clear and moist and mucous membranes are normal.  Eyes: Conjunctivae and EOM are normal. Pupils are equal, round, and reactive to light.  Neck: Normal range of motion. Neck supple.  Cardiovascular: Regular rhythm, S1 normal and S2 normal.  Exam reveals no gallop and no friction rub.   No murmur heard. Pulmonary/Chest: Effort normal and breath sounds normal. No respiratory distress. She exhibits no tenderness.  Abdominal: Soft. Normal appearance and bowel sounds are normal. There is no hepatosplenomegaly. There is no tenderness. There is no rebound, no guarding, no tenderness at McBurney's point and negative Murphy's sign. No hernia.  Musculoskeletal: Normal range of motion.  Neurological: She is alert and oriented to person, place, and time. She has normal strength. No cranial nerve deficit or sensory deficit. Coordination normal.  GCS eye subscore is 4. GCS verbal subscore is 5. GCS motor subscore is 6.  Skin: Skin is warm, dry and intact. No rash noted. No cyanosis.  Psychiatric: She has a normal mood and affect. Her speech is normal and behavior is normal. Thought content normal.  Nursing note and vitals reviewed.  ED Treatments / Results  DIAGNOSTIC STUDIES: Oxygen Saturation is 100% on room air, normal  by my interpretation.    COORDINATION OF CARE: 1:05 AM Discussed treatment plan with pt at bedside and pt agreed to plan.  Labs (all labs ordered are listed, but only abnormal results are displayed) Labs Reviewed  RAPID STREP SCREEN (NOT AT [facility])  CULTURE, GROUP A STREP [facility])    EKG  EKG Interpretation None       Radiology No results found.  Procedures Procedures (including critical care time)  Medications Ordered in ED Medications  predniSONE (DELTASONE) tablet 60 mg (not administered)     Initial Impression / Assessment and Plan / ED Course  I have reviewed the triage vital signs and the nursing notes.  Pertinent labs & imaging results that were available during my care of the patient were reviewed by me and considered in my medical decision making (see chart for details).     Presents with complaints of sore throat for 3 days. She has had some chest congestion and cough. Lungs are clear. Oropharyngeal examination is unremarkable. No evidence of abscess or other significant infection. Rapid strep negative, culture pending.  Final Clinical Impressions(s) / ED Diagnoses   Final diagnoses:  Viral URI with cough    New Prescriptions New Prescriptions   BENZONATATE (TESSALON) 100 MG CAPSULE    Take 1 capsule (100 mg total) by mouth every 8 (eight) hours.     Gilda Crease, MD 04/07/17 [phone]

## 2017-04-09 LAB — CULTURE, GROUP A STREP (THRC)

## 2017-10-12 ENCOUNTER — Encounter (HOSPITAL_COMMUNITY): Payer: Self-pay | Admitting: *Deleted

## 2017-10-12 ENCOUNTER — Other Ambulatory Visit: Payer: Self-pay

## 2017-10-12 ENCOUNTER — Emergency Department (HOSPITAL_COMMUNITY)
Admission: EM | Admit: 2017-10-12 | Discharge: 2017-10-12 | Disposition: A | Discharge status: Home / Self Care | Payer: No Typology Code available for payment source | Attending: Emergency Medicine | Admitting: Emergency Medicine

## 2017-10-12 DIAGNOSIS — H5711 Ocular pain, right eye: Secondary | ICD-10-CM | POA: Insufficient documentation

## 2017-10-12 DIAGNOSIS — Z5321 Procedure and treatment not carried out due to patient leaving prior to being seen by health care provider: Secondary | ICD-10-CM | POA: Insufficient documentation

## 2017-10-12 NOTE — ED Notes (Signed)
No answer for treatment room in lobby, pt presumed to have left without being seen.

## 2017-10-12 NOTE — ED Notes (Signed)
Patient did not answer when called for room 

## 2017-10-12 NOTE — ED Notes (Signed)
No answer when called for room x 2 

## 2017-10-12 NOTE — ED Triage Notes (Signed)
Pt reports right eye irritation, drainage and itching since yesterday. Has mild sensitivity to light.

## 2017-10-14 ENCOUNTER — Encounter (HOSPITAL_COMMUNITY): Payer: Self-pay | Admitting: *Deleted

## 2018-01-15 ENCOUNTER — Other Ambulatory Visit: Payer: Self-pay | Admitting: Family Medicine

## 2018-01-15 ENCOUNTER — Other Ambulatory Visit (HOSPITAL_COMMUNITY)
Admission: RE | Admit: 2018-01-15 | Discharge: 2018-01-15 | Disposition: A | Discharge status: Home / Self Care | Payer: 59 | Source: Ambulatory Visit | Attending: Family Medicine | Admitting: Family Medicine

## 2018-01-15 DIAGNOSIS — Z01411 Encounter for gynecological examination (general) (routine) with abnormal findings: Secondary | ICD-10-CM | POA: Insufficient documentation

## 2018-01-20 LAB — CYTOLOGY - PAP
Diagnosis: NEGATIVE
HPV: NOT DETECTED

## 2018-06-25 ENCOUNTER — Other Ambulatory Visit: Payer: Self-pay | Admitting: Family Medicine

## 2018-06-25 DIAGNOSIS — N938 Other specified abnormal uterine and vaginal bleeding: Secondary | ICD-10-CM

## 2018-06-29 ENCOUNTER — Ambulatory Visit
Admission: RE | Admit: 2018-06-29 | Discharge: 2018-06-29 | Disposition: A | Discharge status: Home / Self Care | Payer: 59 | Source: Ambulatory Visit | Attending: Family Medicine | Admitting: Family Medicine

## 2018-06-29 DIAGNOSIS — N938 Other specified abnormal uterine and vaginal bleeding: Secondary | ICD-10-CM

## 2019-02-14 IMAGING — US US PELVIS COMPLETE TRANSABD/TRANSVAG
1 series · 13 of 25 positions shown · non-contrast
Comparison: None

CLINICAL DATA: Dysfunctional uterine bleeding

EXAM:
TRANSABDOMINAL AND TRANSVAGINAL ULTRASOUND OF PELVIS
TECHNIQUE: Both transabdominal and transvaginal ultrasound examinations of the
pelvis were performed. Transabdominal technique was performed for
global imaging of the pelvis including uterus, ovaries, adnexal
regions, and pelvic cul-de-sac. It was necessary to proceed with
endovaginal exam following the transabdominal exam to visualize the
uterus endometrium and ovaries.

[Series 1: us pelvis complete transabd/transvag · 0.34mm/px · 13 of 50 slices shown]
[im 1/50]
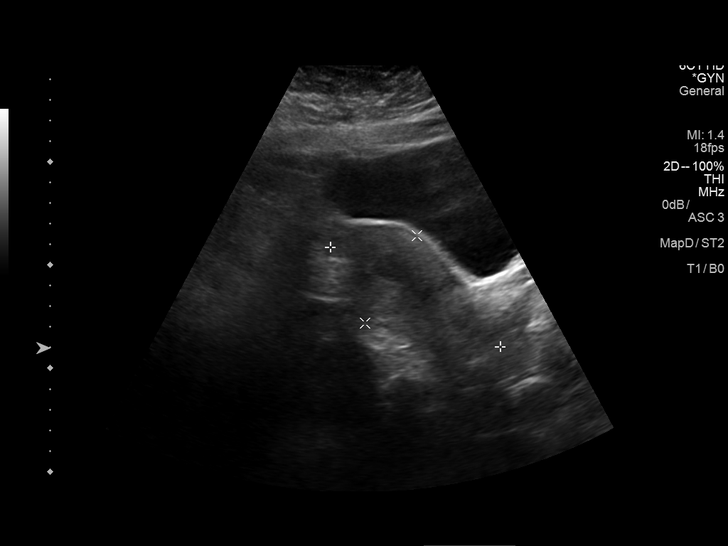
[im 5/50]
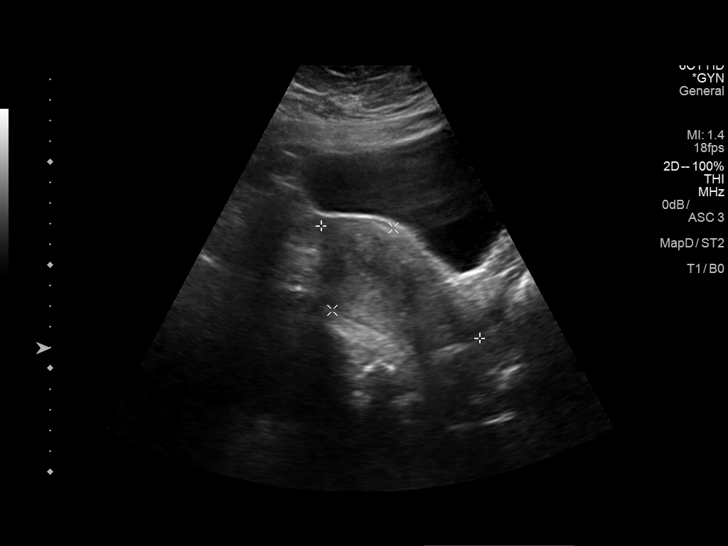
[im 9/50]
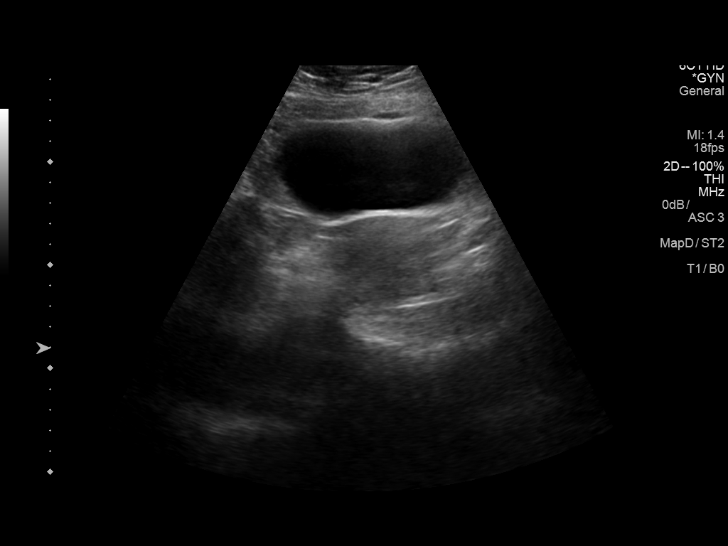
[im 13/50]
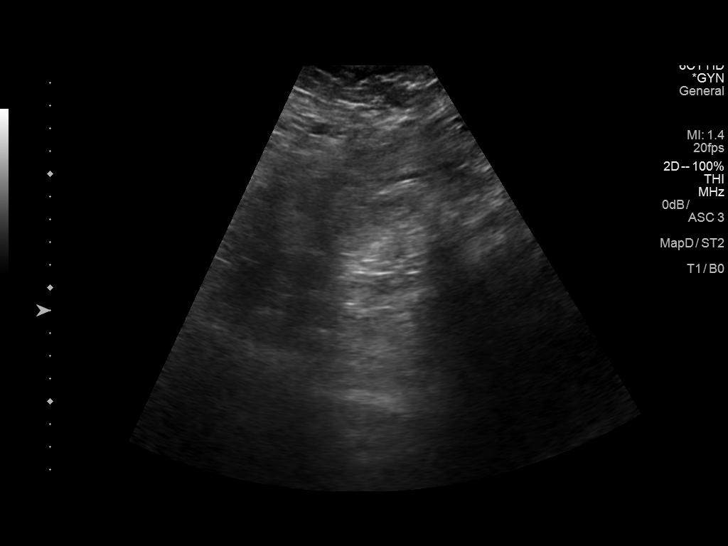
[im 17/50]
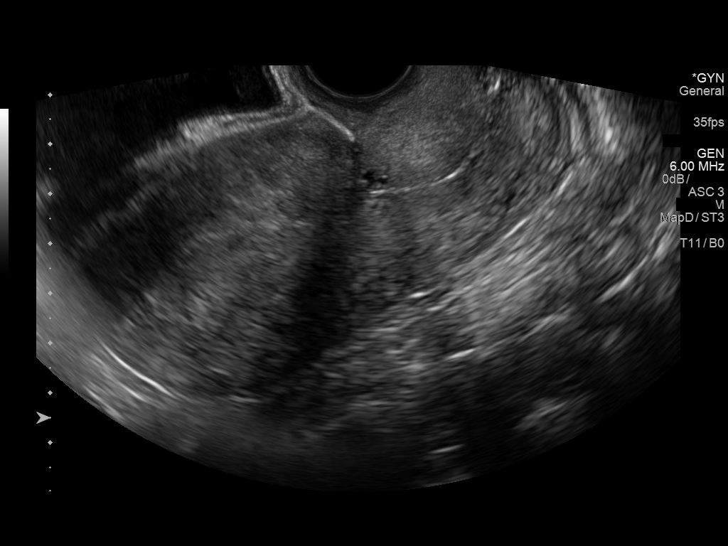
[im 21/50]
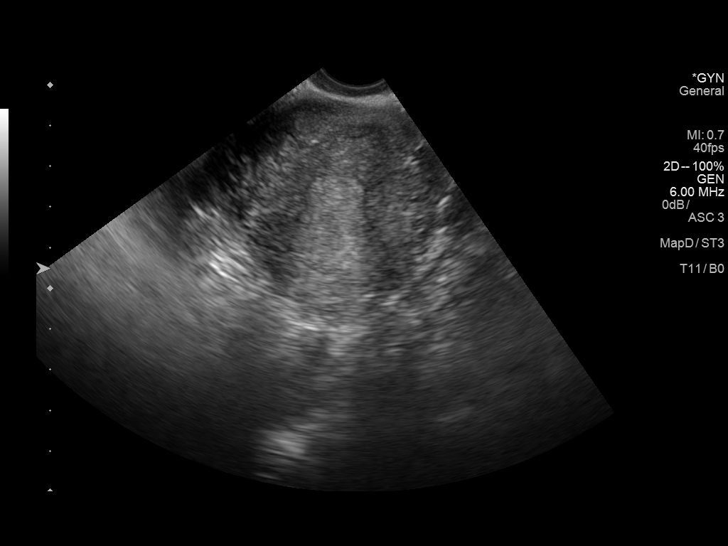
[im 25/50]
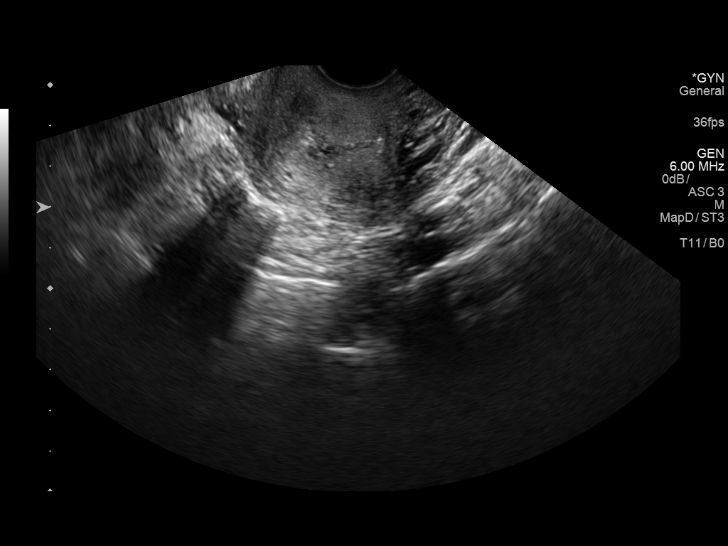
[im 29/50]
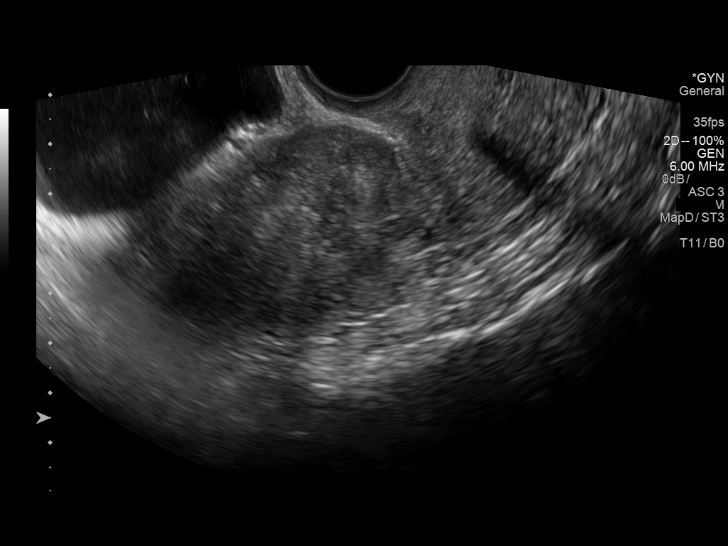
[im 33/50]
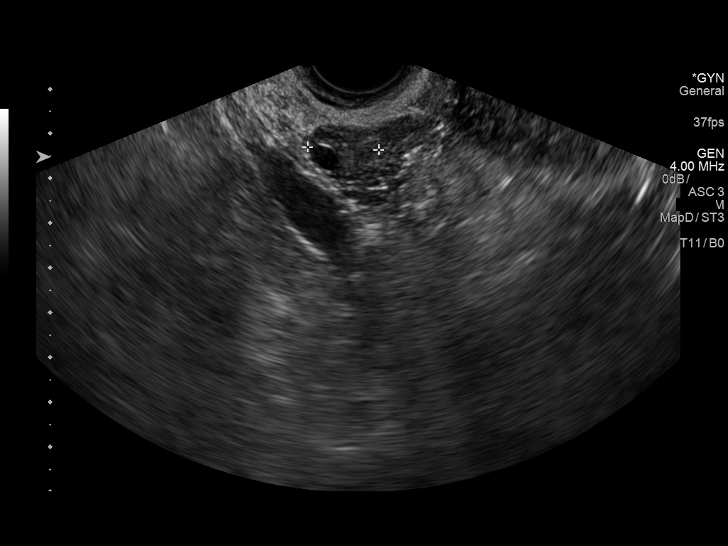
[im 37/50]
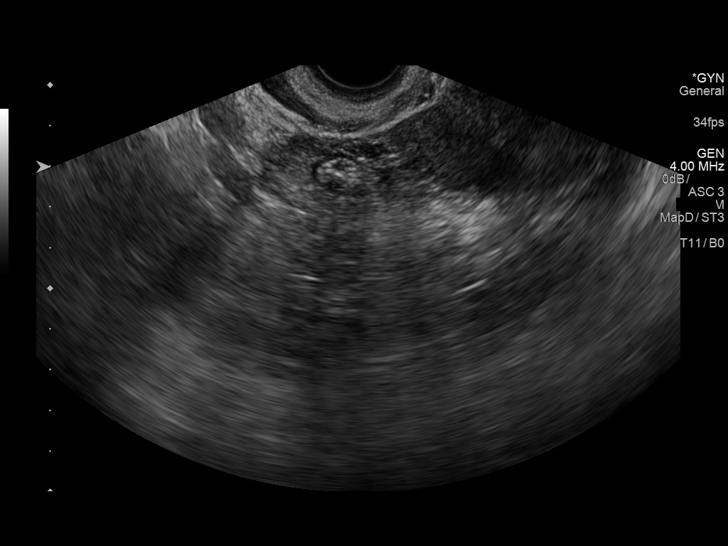
[im 41/50]
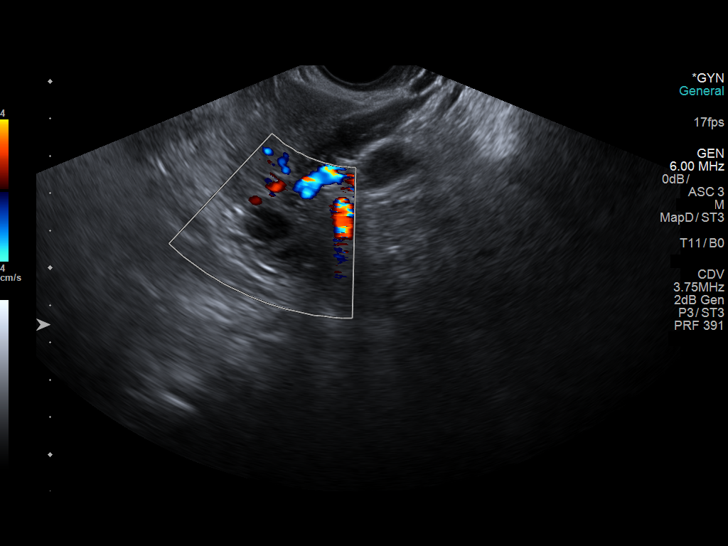
[im 45/50]
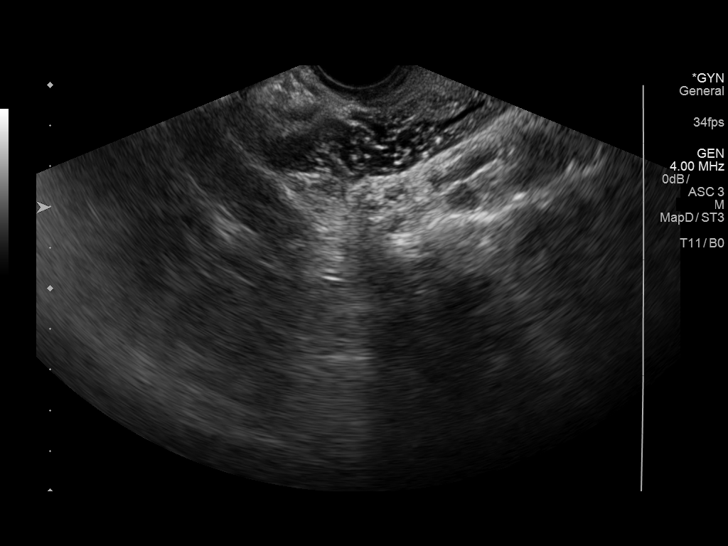
[im 50/50]
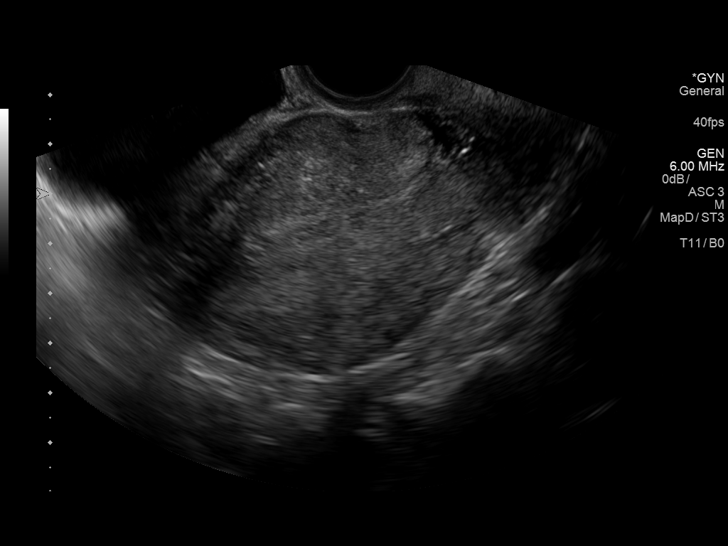

[13 of 25 positions shown; findings below may reference images not displayed]

FINDINGS: Uterus

Measurements: 10.4 x 5 x 5.7 cm. No fibroids or other mass
visualized.

Endometrium

Thickness: 7.5 mm.  No focal abnormality visualized.

Right ovary

Measurements: 2.7 x 1.6 x 1.6 cm. Normal appearance/no adnexal mass.

Left ovary

Measurements: 4 x 2.5 x 2.8 cm.  Normal appearance/no adnexal mass.

Other findings

No abnormal free fluid.
IMPRESSION: 1. Endometrial thickness of 7.5 mm. If bleeding remains unresponsive
to hormonal or medical therapy, sonohysterogram should be considered
for focal lesion work-up. (Ref: Radiological Reasoning: Algorithmic
Workup of Abnormal Vaginal Bleeding with Endovaginal Sonography and
Sonohysterography. AJR 2008; 191:S68-73)
2. Negative pelvic ultrasound

## 2020-08-27 ENCOUNTER — Ambulatory Visit (HOSPITAL_COMMUNITY)
Admission: EM | Admit: 2020-08-27 | Discharge: 2020-08-27 | Disposition: A | Discharge status: Home / Self Care | Payer: 59 | Attending: Family Medicine | Admitting: Family Medicine

## 2020-08-27 ENCOUNTER — Encounter (HOSPITAL_COMMUNITY): Payer: Self-pay | Admitting: *Deleted

## 2020-08-27 ENCOUNTER — Other Ambulatory Visit: Payer: Self-pay

## 2020-08-27 DIAGNOSIS — G43411 Hemiplegic migraine, intractable, with status migrainosus: Secondary | ICD-10-CM

## 2020-08-27 MED ORDER — ONDANSETRON 4 MG PO TBDP
ORAL_TABLET | ORAL | Status: AC
  Filled 2020-08-27: qty 1

## 2020-08-27 MED ORDER — ONDANSETRON 4 MG PO TBDP
4.0000 mg | ORAL_TABLET | Freq: Once | ORAL | Status: AC
  Administered 2020-08-27: 4 mg via ORAL

## 2020-08-27 MED ORDER — KETOROLAC TROMETHAMINE 30 MG/ML IJ SOLN
INTRAMUSCULAR | Status: AC
  Filled 2020-08-27: qty 1

## 2020-08-27 MED ORDER — KETOROLAC TROMETHAMINE 30 MG/ML IJ SOLN
30.0000 mg | Freq: Once | INTRAMUSCULAR | Status: AC
  Administered 2020-08-27: 30 mg via INTRAMUSCULAR

## 2020-08-27 MED ORDER — DEXAMETHASONE SODIUM PHOSPHATE 10 MG/ML IJ SOLN
10.0000 mg | Freq: Once | INTRAMUSCULAR | Status: AC
  Administered 2020-08-27: 10 mg via INTRAMUSCULAR

## 2020-08-27 MED ORDER — DEXAMETHASONE SODIUM PHOSPHATE 10 MG/ML IJ SOLN
INTRAMUSCULAR | Status: AC
  Filled 2020-08-27: qty 1

## 2020-08-27 NOTE — ED Triage Notes (Signed)
Pt reports rt jaw pain that started Friday with a HA and Rt arm numbness. Today Pt has nausea , HA decreased today after taking OTC. Pt reports Rt arm tingling today . Pt has full range of motion with RT arm. Pt denies any vision changes.

## 2020-08-27 NOTE — ED Provider Notes (Signed)
[facility]    CSN: [id number] Arrival date & time: 08/27/20  3016      History   Chief Complaint Chief Complaint  Patient presents with  . Jaw Pain    RT  . Headache    HPI Bethany Reid is a 44 y.o. female.   Patient is a 44 year old female presents today for headache, right facial pain, right arm numbness.  Reporting that the right jaw pain started Friday along with a headache.  She also has some associated photophobia and nausea.  No blurred vision or trouble with vision.  Headache decreased slightly with taking over-the-counter medicines but return.  Numbness and tingling and mild weakness in the right arm with decreased grip strength.  Full range of motion of the right arm and no pain in the arm.  No confusion, slurred speech, facial droop.  No history of high blood pressure.     History reviewed. No pertinent past medical history.  There are no problems to display for this patient.   History reviewed. No pertinent surgical history.  OB History   No obstetric history on file.      Home Medications    Prior to Admission medications   Medication Sig Start Date End Date Taking? Authorizing Provider  ibuprofen (ADVIL,MOTRIN) 200 MG tablet Take 800 mg by mouth every 6 (six) hours as needed for moderate pain.    Yes [provider]  loratadine (CLARITIN) 10 MG tablet Take 1 tablet (10 mg total) by mouth daily as needed for allergies or rhinitis. 04/22/15   Purvis Sheffield, MD  fluticasone (FLONASE) 50 MCG/ACT nasal spray Place 2 sprays into both nostrils daily. 04/22/15 08/27/20  Purvis Sheffield, MD    Family History History reviewed. No pertinent family history.  Social History Social History   Tobacco Use  . Smoking status: Never Smoker  . Smokeless tobacco: Never Used  Substance Use Topics  . Alcohol use: No  . Drug use: No     Allergies   Other and Penicillins   Review of Systems Review of Systems   Physical  Exam Triage Vital Signs ED Triage Vitals  Enc Vitals Group     BP 08/27/20 0857 125/90     Pulse Rate 08/27/20 0857 76     Resp 08/27/20 0857 18     Temp 08/27/20 0857 97.9 F (36.6 C)     Temp src --      SpO2 08/27/20 0857 100 %     Weight 08/27/20 0902 225 lb (102.1 kg)     Height 08/27/20 0902 5\' 4"  (1.626 m)     Head Circumference --      Peak Flow --      Pain Score 08/27/20 0901 8     Pain Loc --      Pain Edu? --      Excl. in GC? --    No data found.  Updated Vital Signs BP 125/90 (BP Location: Right Arm)   Pulse 76   Temp 97.9 F (36.6 C)   Resp 18   Ht 5\' 4"  (1.626 m)   Wt 225 lb (102.1 kg)   LMP 08/03/2020   SpO2 100%   BMI 38.62 kg/m   Visual Acuity Right Eye Distance:   Left Eye Distance:   Bilateral Distance:    Right Eye Near:   Left Eye Near:    Bilateral Near:     Physical Exam Vitals and nursing note reviewed.  Constitutional:  General: She is not in acute distress.    Appearance: Normal appearance. She is not ill-appearing, toxic-appearing or diaphoretic.  HENT:     Head: Normocephalic.     Nose: Nose normal.     Mouth/Throat:     Pharynx: Oropharynx is clear.  Eyes:     General: No visual field deficit.    Extraocular Movements:     Right eye: Normal extraocular motion and no nystagmus.     Left eye: Normal extraocular motion and no nystagmus.     Conjunctiva/sclera: Conjunctivae normal.     Pupils:     Right eye: Pupil is round and reactive.     Left eye: Pupil is round and reactive.  Pulmonary:     Effort: Pulmonary effort is normal.  Musculoskeletal:        General: Normal range of motion.     Cervical back: Normal range of motion.  Skin:    General: Skin is warm and dry.     Findings: No rash.  Neurological:     Mental Status: She is alert.     GCS: GCS eye subscore is 4. GCS verbal subscore is 5. GCS motor subscore is 6.     Cranial Nerves: No cranial nerve deficit, dysarthria or facial asymmetry.     Sensory: No  sensory deficit.     Motor: Weakness present.     Coordination: Romberg sign negative. Coordination normal.     Gait: Gait normal.  Psychiatric:        Mood and Affect: Mood normal.        Speech: Speech normal.        Behavior: Behavior normal.      UC Treatments / Results  Labs (all labs ordered are listed, but only abnormal results are displayed) Labs Reviewed - No data to display  EKG   Radiology No results found.  Procedures Procedures (including critical care time)  Medications Ordered in UC Medications  dexamethasone (DECADRON) injection 10 mg (has no administration in time range)  ketorolac (TORADOL) 30 MG/ML injection 30 mg (has no administration in time range)  ondansetron (ZOFRAN-ODT) disintegrating tablet 4 mg (has no administration in time range)    Initial Impression / Assessment and Plan / UC Course  I have reviewed the triage vital signs and the nursing notes.  Pertinent labs & imaging results that were available during my care of the patient were reviewed by me and considered in my medical decision making (see chart for details).     Hemiplegic migraine.  This is most likely diagnosis based on symptoms Doubt CVA at this time. Mild decreased grip strength on the right No other concerns on exam We will treat with migraine cocktail here today.  Strict ER and return precautions given. For continued symptoms or worse go to the ER Follow up as needed for continued or worsening symptoms   Final Clinical Impressions(s) / UC Diagnoses   Final diagnoses:  Intractable hemiplegic migraine with status migrainosus     Discharge Instructions     I believe that your symptoms are migraine related.  Medicines given here to treat. If your symptoms do not resolve or worsen please go to the ER for a CT scan.      ED Prescriptions    None     PDMP not reviewed this encounter.   Janace Aris, NP 08/27/20 0945

## 2020-08-27 NOTE — Discharge Instructions (Signed)
I believe that your symptoms are migraine related.  Medicines given here to treat. If your symptoms do not resolve or worsen please go to the ER for a CT scan.

## 2020-10-15 ENCOUNTER — Other Ambulatory Visit: Payer: Self-pay

## 2020-10-15 ENCOUNTER — Ambulatory Visit (HOSPITAL_COMMUNITY)
Admission: EM | Admit: 2020-10-15 | Discharge: 2020-10-15 | Disposition: A | Discharge status: Home / Self Care | Payer: HRSA Program | Attending: Family Medicine | Admitting: Family Medicine

## 2020-10-15 ENCOUNTER — Encounter (HOSPITAL_COMMUNITY): Payer: Self-pay | Admitting: Emergency Medicine

## 2020-10-15 DIAGNOSIS — J069 Acute upper respiratory infection, unspecified: Secondary | ICD-10-CM | POA: Insufficient documentation

## 2020-10-15 DIAGNOSIS — Z20822 Contact with and (suspected) exposure to covid-19: Secondary | ICD-10-CM | POA: Insufficient documentation

## 2020-10-15 NOTE — Discharge Instructions (Addendum)
Go home to rest Drink plenty of fluids Take Tylenol for pain or fever You may take over-the-counter cough and cold medicines as needed.  Try mucinex to loosen mucous and flonase for the sinus congestion You must quarantine at home until your test result is available You can check for your test result in MyChart

## 2020-10-15 NOTE — ED Triage Notes (Signed)
Pt states that she has a sore throat, nasal congestion, and a cough that started 12/31. Pt sates that she has tried OTC medication and nothing has helped.

## 2020-10-16 LAB — SARS CORONAVIRUS 2 (TAT 6-24 HRS): SARS Coronavirus 2: NEGATIVE

## 2020-10-16 NOTE — ED Provider Notes (Signed)
[facility]    CSN: [id number] Arrival date & time: 10/15/20  1408      History   Chief Complaint Chief Complaint  Patient presents with  . Sore Throat  . Cough  . Nasal Congestion    HPI Bethany Reid is a 45 y.o. female.   HPI   Patient states that she had a sore throat, nasal congestion, cough for the last 4 days.  She has had no headache or body aches, no fever or chills.  No known exposure to Covid.  She is not Covid vaccinated. She does have a history of allergies Non-smoker  History reviewed. No pertinent past medical history.  There are no problems to display for this patient.   History reviewed. No pertinent surgical history.  OB History   No obstetric history on file.      Home Medications    Prior to Admission medications   Medication Sig Start Date End Date Taking? Authorizing Provider  ibuprofen (ADVIL,MOTRIN) 200 MG tablet Take 800 mg by mouth every 6 (six) hours as needed for moderate pain.     [provider]  loratadine (CLARITIN) 10 MG tablet Take 1 tablet (10 mg total) by mouth daily as needed for allergies or rhinitis. 04/22/15   Purvis Sheffield, MD  fluticasone (FLONASE) 50 MCG/ACT nasal spray Place 2 sprays into both nostrils daily. 04/22/15 08/27/20  Purvis Sheffield, MD    Family History Family History  Problem Relation Age of Onset  . Healthy Mother     Social History Social History   Tobacco Use  . Smoking status: Never Smoker  . Smokeless tobacco: Never Used  Substance Use Topics  . Alcohol use: No  . Drug use: No     Allergies   Other and Penicillins   Review of Systems Review of Systems   Physical Exam Triage Vital Signs ED Triage Vitals  Enc Vitals Group     BP 10/15/20 1551 111/80     Pulse Rate 10/15/20 1551 72     Resp 10/15/20 1551 18     Temp 10/15/20 1551 98.3 F (36.8 C)     Temp Source 10/15/20 1551 Oral     SpO2 10/15/20 1551 100 %     Weight --      Height --       Head Circumference --      Peak Flow --      Pain Score 10/15/20 1549 8     Pain Loc --      Pain Edu? --      Excl. in GC? --    No data found.  Updated Vital Signs BP 111/80 (BP Location: Right Arm)   Pulse 72   Temp 98.3 F (36.8 C) (Oral)   Resp 18   LMP 09/24/2020 (Approximate)   SpO2 100%       Physical Exam Constitutional:      General: She is not in acute distress.    Appearance: She is well-developed and well-nourished. She is obese. She is not ill-appearing.  HENT:     Head: Normocephalic and atraumatic.     Right Ear: Tympanic membrane and ear canal normal.     Left Ear: Tympanic membrane and ear canal normal.     Nose: Congestion and rhinorrhea present.     Comments: Clear rhinorrhea    Mouth/Throat:     Pharynx: Posterior oropharyngeal erythema present.     Comments: Mild erythema Eyes:  Conjunctiva/sclera: Conjunctivae normal.     Pupils: Pupils are equal, round, and reactive to light.  Cardiovascular:     Rate and Rhythm: Normal rate.     Heart sounds: Normal heart sounds.  Pulmonary:     Effort: Pulmonary effort is normal. No respiratory distress.     Breath sounds: Normal breath sounds.  Abdominal:     Palpations: Abdomen is soft.  Musculoskeletal:        General: No edema. Normal range of motion.     Cervical back: Normal range of motion.  Skin:    General: Skin is warm and dry.  Neurological:     Mental Status: She is alert.  Psychiatric:        Behavior: Behavior normal.      UC Treatments / Results  Labs (all labs ordered are listed, but only abnormal results are displayed) Labs Reviewed  SARS CORONAVIRUS 2 (TAT 6-24 HRS)    EKG   Radiology No results found.  Procedures Procedures (including critical care time)  Medications Ordered in UC Medications - No data to display  Initial Impression / Assessment and Plan / UC Course  I have reviewed the triage vital signs and the nursing notes.  Pertinent labs & imaging  results that were available during my care of the patient were reviewed by me and considered in my medical decision making (see chart for details).     Patient is here for Covid testing.  This is performed.  The importance of quarantine as discussed.  Over-the-counter medicines are recommended. Final Clinical Impressions(s) / UC Diagnoses   Final diagnoses:  Viral upper respiratory tract infection  Encounter for laboratory testing for COVID-19 virus     Discharge Instructions     Go home to rest Drink plenty of fluids Take Tylenol for pain or fever You may take over-the-counter cough and cold medicines as needed.  Try mucinex to loosen mucous and flonase for the sinus congestion You must quarantine at home until your test result is available You can check for your test result in MyChart     ED Prescriptions    None     PDMP not reviewed this encounter.   Eustace Moore, MD 10/16/20 [phone]

## 2022-05-10 ENCOUNTER — Emergency Department (HOSPITAL_BASED_OUTPATIENT_CLINIC_OR_DEPARTMENT_OTHER)
Admission: EM | Admit: 2022-05-10 | Discharge: 2022-05-10 | Disposition: A | Discharge status: Home / Self Care | Payer: Self-pay | Attending: Emergency Medicine | Admitting: Emergency Medicine

## 2022-05-10 ENCOUNTER — Encounter (HOSPITAL_BASED_OUTPATIENT_CLINIC_OR_DEPARTMENT_OTHER): Payer: Self-pay

## 2022-05-10 ENCOUNTER — Emergency Department (HOSPITAL_BASED_OUTPATIENT_CLINIC_OR_DEPARTMENT_OTHER): Payer: Self-pay

## 2022-05-10 ENCOUNTER — Other Ambulatory Visit: Payer: Self-pay

## 2022-05-10 DIAGNOSIS — R519 Headache, unspecified: Secondary | ICD-10-CM | POA: Insufficient documentation

## 2022-05-10 DIAGNOSIS — R42 Dizziness and giddiness: Secondary | ICD-10-CM | POA: Insufficient documentation

## 2022-05-10 DIAGNOSIS — R55 Syncope and collapse: Secondary | ICD-10-CM | POA: Insufficient documentation

## 2022-05-10 LAB — URINALYSIS, ROUTINE W REFLEX MICROSCOPIC
Bilirubin Urine: NEGATIVE
Glucose, UA: NEGATIVE mg/dL
Hgb urine dipstick: NEGATIVE
Ketones, ur: NEGATIVE mg/dL
Nitrite: NEGATIVE
Specific Gravity, Urine: 1.018 (ref 1.005–1.030)
pH: 6.5 (ref 5.0–8.0)

## 2022-05-10 LAB — BASIC METABOLIC PANEL
Anion gap: 6 (ref 5–15)
BUN: 8 mg/dL (ref 6–20)
CO2: 26 mmol/L (ref 22–32)
Calcium: 8.8 mg/dL — ABNORMAL LOW (ref 8.9–10.3)
Chloride: 104 mmol/L (ref 98–111)
Creatinine, Ser: 0.78 mg/dL (ref 0.44–1.00)
GFR, Estimated: >60 mL/min (ref >60)
Glucose, Bld: 91 mg/dL (ref 70–99)
Potassium: 3.8 mmol/L (ref 3.5–5.1)
Sodium: 136 mmol/L (ref 135–145)

## 2022-05-10 LAB — CBC
HCT: 38.5 % (ref 36.0–46.0)
Hemoglobin: 12.8 g/dL (ref 12.0–15.0)
MCH: 29 pg (ref 26.0–34.0)
MCHC: 33.2 g/dL (ref 30.0–36.0)
MCV: 87.3 fL (ref 80.0–100.0)
Platelets: 242 10*3/uL (ref 150–400)
RBC: 4.41 MIL/uL (ref 3.87–5.11)
RDW: 12.5 % (ref 11.5–15.5)
WBC: 8.2 10*3/uL (ref 4.0–10.5)
nRBC: 0 % (ref 0.0–0.2)

## 2022-05-10 LAB — PREGNANCY, URINE: Preg Test, Ur: NEGATIVE

## 2022-05-10 LAB — CBG MONITORING, ED: Glucose-Capillary: 90 mg/dL (ref 70–99)

## 2022-05-10 MED ORDER — ONDANSETRON 4 MG PO TBDP
4.0000 mg | ORAL_TABLET | Freq: Once | ORAL | Status: AC
  Administered 2022-05-10: 4 mg via ORAL
  Filled 2022-05-10: qty 1

## 2022-05-10 MED ORDER — PROCHLORPERAZINE EDISYLATE 10 MG/2ML IJ SOLN: 10.0000 mg | Freq: Once | INTRAMUSCULAR | Status: DC

## 2022-05-10 MED ORDER — SODIUM CHLORIDE 0.9 % IV BOLUS (SEPSIS): 1000.0000 mL | Freq: Once | INTRAVENOUS | Status: DC

## 2022-05-10 MED ORDER — ACETAMINOPHEN 325 MG PO TABS: 650.0000 mg | ORAL_TABLET | Freq: Once | ORAL | Status: DC

## 2022-05-10 NOTE — Discharge Instructions (Signed)
Make sure to drink plenty of fluids and stay well-hydrated.  Follow-up with your primary care doctor next week to be rechecked.  Return to the ED for recurrent symptoms

## 2022-05-10 NOTE — ED Triage Notes (Signed)
Patient here POV from Home.  Endorses having a Syncopal Episode 2 Weeks ago while at Rest.   Near Syncopal Episode Last PM. Patient felt lightheaded and dizzy but was able to sleep. Endorses continued Symptoms however.   No History of DM. No Known Fevers. Moderate Nausea. No Emesis. No Diarrhea.   NAD Noted during Triage. A&Ox4. GCS 15. Ambulatory.

## 2022-05-10 NOTE — ED Notes (Signed)
Patient transported to CT 

## 2022-05-10 NOTE — ED Notes (Signed)
Pt states that her head feels a little better than when she arrived but is still close to the same number of 8 out of 10. However, pt states that she will be foine and she wants to go home and eat and is ready to leave. Pt is alert oriented and ambulatory. Md notified.

## 2022-05-10 NOTE — ED Notes (Signed)
Should be oral order instead of standing after consultation with dr. Lynelle Doctor

## 2022-05-10 NOTE — ED Provider Notes (Signed)
[facility] EMERGENCY DEPT Provider Note   CSN: [id number] Arrival date & time: 05/10/22  1157     History  Chief Complaint  Patient presents with   Near Syncope    Bethany Reid is a 46 y.o. female.   Near Syncope     Pt had a syncopal episode two weeks ago while she was at [employer].  Last night she had a recurrent episode but she did not pass out.  Pt felt like she had a migraine at the time.  She has had migraines in the past intermittently.  No thunder clap onset.  No vomiting or diarrhea.  Some persistent nausea.  No heart palpitations.  No dysuria.  Home Medications Prior to Admission medications   Medication Sig Start Date End Date Taking? Authorizing Provider  ibuprofen (ADVIL,MOTRIN) 200 MG tablet Take 800 mg by mouth every 6 (six) hours as needed for moderate pain.     [provider]  loratadine (CLARITIN) 10 MG tablet Take 1 tablet (10 mg total) by mouth daily as needed for allergies or rhinitis. 04/22/15   Purvis Sheffield, MD  fluticasone (FLONASE) 50 MCG/ACT nasal spray Place 2 sprays into both nostrils daily. 04/22/15 08/27/20  Purvis Sheffield, MD      Allergies    Other and Penicillins    Review of Systems   Review of Systems  Cardiovascular:  Positive for near-syncope.    Physical Exam Updated Vital Signs BP (!) 140/101   Pulse 68   Temp 97.7 F (36.5 C)   Resp 18   Ht 1.626 m (5\' 4" )   Wt 102.1 kg   SpO2 100%   BMI 38.64 kg/m  Physical Exam Vitals and nursing note reviewed.  Constitutional:      General: She is not in acute distress.    Appearance: She is well-developed.  HENT:     Head: Normocephalic and atraumatic.     Right Ear: External ear normal.     Left Ear: External ear normal.  Eyes:     General: No scleral icterus.       Right eye: No discharge.        Left eye: No discharge.     Conjunctiva/sclera: Conjunctivae normal.  Neck:     Trachea: No tracheal deviation.  Cardiovascular:     Rate  and Rhythm: Normal rate and regular rhythm.  Pulmonary:     Effort: Pulmonary effort is normal. No respiratory distress.     Breath sounds: Normal breath sounds. No stridor. No wheezing or rales.  Abdominal:     General: Bowel sounds are normal. There is no distension.     Palpations: Abdomen is soft.     Tenderness: There is no abdominal tenderness. There is no guarding or rebound.  Musculoskeletal:        General: No tenderness.     Cervical back: Neck supple.  Skin:    General: Skin is warm and dry.     Findings: No rash.  Neurological:     Mental Status: She is alert and oriented to person, place, and time.     Cranial Nerves: No cranial nerve deficit.     Sensory: No sensory deficit.     Motor: No abnormal muscle tone or seizure activity.     Coordination: Coordination normal.     Comments: No pronator drift bilateral upper extrem, able to hold both legs off bed for 5 seconds, sensation intact in all extremities, no visual field cuts,  no left or right sided neglect, normal finger-nose exam bilaterally, no nystagmus noted  No facial droop, extraocular movements intact, tongue midline     ED Results / Procedures / Treatments   Labs (all labs ordered are listed, but only abnormal results are displayed) Labs Reviewed  BASIC METABOLIC PANEL - Abnormal; Notable for the following components:      Result Value   Calcium 8.8 (*)    All other components within normal limits  URINALYSIS, ROUTINE W REFLEX MICROSCOPIC - Abnormal; Notable for the following components:   APPearance HAZY (*)    Protein, ur TRACE (*)    Leukocytes,Ua MODERATE (*)    Bacteria, UA FEW (*)    All other components within normal limits  CBC  PREGNANCY, URINE  CBG MONITORING, ED    EKG EKG Interpretation  Date/Time:  Saturday May 10 2022 12:59:11 EDT Ventricular Rate:  70 PR Interval:  124 QRS Duration: 86 QT Interval:  396 QTC Calculation: 427 R Axis:   86 Text Interpretation: Normal sinus  rhythm with sinus arrhythmia Normal ECG When compared with ECG of 22-Apr-2015 16:17, No significant change since last tracing Confirmed by Linwood Dibbles [phone]) on 05/10/2022 3:00:19 PM  Radiology CT Head Wo Contrast  Result Date: 05/10/2022 CLINICAL DATA:  Syncope, dizziness EXAM: CT HEAD WITHOUT CONTRAST TECHNIQUE: Contiguous axial images were obtained from the base of the skull through the vertex without intravenous contrast. RADIATION DOSE REDUCTION: This exam was performed according to the departmental dose-optimization program which includes automated exposure control, adjustment of the mA and/or kV according to patient size and/or use of iterative reconstruction technique. COMPARISON:  None Available. FINDINGS: Brain: No acute intracranial findings are seen. There are no signs of bleeding within the cranium. Ventricles are not dilated. There is no focal edema or mass effect. Vascular: Unremarkable. Skull: Unremarkable. Sinuses/Orbits: Unremarkable. Other: None. IMPRESSION: No acute intracranial findings are seen noncontrast CT brain. Electronically Signed   By: Ernie Avena M.D.   On: 05/10/2022 15:48    Procedures Procedures    Medications Ordered in ED Medications  sodium chloride 0.9 % bolus 1,000 mL (has no administration in time range)  acetaminophen (TYLENOL) tablet 650 mg (has no administration in time range)  prochlorperazine (COMPAZINE) injection 10 mg (has no administration in time range)    ED Course/ Medical Decision Making/ A&P Clinical Course as of 05/10/22 1653  Sat May 10, 2022  1629 CBC Normal [JK]  1629 Urinalysis, Routine w reflex microscopic Urine, Clean Catch(!) Patient without any urinary symptoms.  Most likely consistent with urinary contamination [JK]  1629 Pregnancy, urine Negative [JK]  1630 CT Head Wo Contrast Head CT without acute findings [JK]    Clinical Course User Index [JK] Linwood Dibbles, MD                           Medical Decision  Making Problems Addressed: Near syncope: acute illness or injury  Amount and/or Complexity of Data Reviewed Labs: ordered. Decision-making details documented in ED Course. Radiology: ordered. Decision-making details documented in ED Course.  Risk OTC drugs. Prescription drug management.   Patient presented to the ED for evaluation of a near syncopal episode.  Patient did have a syncopal episode 2 weeks ago initially.  No clear etiology based on her symptoms at that time.  Patient did not have any medical evaluation.  Patient did have a slight headache tonight so consider the possibility of cerebral hemorrhage or  tumor.  CT scan fortunately is unremarkable.  Patient does have history of migraines but is possible that could be the cause of the headache.  Symptoms have mostly resolved here in the ED so no need for emergent treatment.  No signs of anemia.  No acute electrolyte abnormalities.  Work-up reassuring.  Discussed remaining well-hydrated.  Follow-up with PCP next week.  Return to the ED for recurrent symptoms  Evaluation and diagnostic testing in the emergency department does not suggest an emergent condition requiring admission or immediate intervention beyond what has been performed at this time.  The patient is safe for discharge and has been instructed to return immediately for worsening symptoms, change in symptoms or any other concerns.         Final Clinical Impression(s) / ED Diagnoses Final diagnoses:  Near syncope    Rx / DC Orders ED Discharge Orders     None         Dorie Rank, MD 05/10/22 [phone]

## 2023-09-23 ENCOUNTER — Telehealth: Payer: Self-pay | Admitting: General Practice

## 2023-09-23 NOTE — Telephone Encounter (Signed)
Pt's sister is a current patient of PCP and recommended that she call our office to become a patient as well. Please advise if okay to add as new patient

## 2023-09-23 NOTE — Telephone Encounter (Signed)
That's fine

## 2024-08-01 NOTE — Telephone Encounter (Signed)
 Called pt to inform they can schedule and was unable to lvm.

## 2024-08-01 NOTE — Telephone Encounter (Signed)
 Pt just called to schedule. Wanted to confirm that pt is okay to establish since It has been so long since approval was received,

## 2024-09-13 ENCOUNTER — Ambulatory Visit: Payer: Self-pay | Admitting: Family Medicine

## 2024-09-13 ENCOUNTER — Encounter: Payer: Self-pay | Admitting: Family Medicine

## 2024-09-13 VITALS — BP 136/86 | HR 83 | Temp 98.0°F | Resp 16 | Ht 64.0 in | Wt 211.2 lb

## 2024-09-13 DIAGNOSIS — Z1231 Encounter for screening mammogram for malignant neoplasm of breast: Secondary | ICD-10-CM

## 2024-09-13 DIAGNOSIS — Z114 Encounter for screening for human immunodeficiency virus [HIV]: Secondary | ICD-10-CM

## 2024-09-13 DIAGNOSIS — Z1159 Encounter for screening for other viral diseases: Secondary | ICD-10-CM

## 2024-09-13 DIAGNOSIS — Z1211 Encounter for screening for malignant neoplasm of colon: Secondary | ICD-10-CM

## 2024-09-13 DIAGNOSIS — Z0000 Encounter for general adult medical examination without abnormal findings: Secondary | ICD-10-CM

## 2024-09-13 DIAGNOSIS — Z23 Encounter for immunization: Secondary | ICD-10-CM

## 2024-09-13 DIAGNOSIS — Z13220 Encounter for screening for lipoid disorders: Secondary | ICD-10-CM

## 2024-09-13 LAB — LIPID PANEL
Cholesterol: 177 mg/dL (ref 0–200)
HDL: 58.8 mg/dL (ref >39.00)
LDL Cholesterol: 101 mg/dL — ABNORMAL HIGH (ref 0–99)
NonHDL: 118.26
Total CHOL/HDL Ratio: 3
Triglycerides: 85 mg/dL (ref 0.0–149.0)
VLDL: 17 mg/dL (ref 0.0–40.0)

## 2024-09-13 LAB — COMPREHENSIVE METABOLIC PANEL WITH GFR
ALT: 14 U/L (ref 0–35)
AST: 13 U/L (ref 0–37)
Albumin: 4.2 g/dL (ref 3.5–5.2)
Alkaline Phosphatase: 45 U/L (ref 39–117)
BUN: 13 mg/dL (ref 6–23)
CO2: 30 meq/L (ref 19–32)
Calcium: 9.2 mg/dL (ref 8.4–10.5)
Chloride: 104 meq/L (ref 96–112)
Creatinine, Ser: 0.73 mg/dL (ref 0.40–1.20)
GFR: 97.59 mL/min (ref >60.00)
Glucose, Bld: 87 mg/dL (ref 70–99)
Potassium: 3.7 meq/L (ref 3.5–5.1)
Sodium: 138 meq/L (ref 135–145)
Total Bilirubin: 0.6 mg/dL (ref 0.2–1.2)
Total Protein: 7 g/dL (ref 6.0–8.3)

## 2024-09-13 LAB — CBC
HCT: 36.1 % (ref 36.0–46.0)
Hemoglobin: 12.1 g/dL (ref 12.0–15.0)
MCHC: 33.6 g/dL (ref 30.0–36.0)
MCV: 88.5 fl (ref 78.0–100.0)
Platelets: 249 K/uL (ref 150.0–400.0)
RBC: 4.08 Mil/uL (ref 3.87–5.11)
RDW: 13.4 % (ref 11.5–15.5)
WBC: 6.7 K/uL (ref 4.0–10.5)

## 2024-09-13 MED ORDER — METHOCARBAMOL 500 MG PO TABS: 500.0000 mg | ORAL_TABLET | Freq: Three times a day (TID) | ORAL | 0 refills | Status: DC | PRN

## 2024-09-13 MED ORDER — NAPROXEN 500 MG PO TABS: 500.0000 mg | ORAL_TABLET | Freq: Two times a day (BID) | ORAL | 1 refills | Status: AC

## 2024-09-13 NOTE — Progress Notes (Signed)
 Chief Complaint  Patient presents with   Establish Care    Establishing Care     Well Woman Bethany Reid is here for a complete physical.   Her last physical was >1 year ago.  Current diet: in general, diet could be better. Current exercise: walking. Weight is stable and she confirms.  Seatbelt? Yes Advanced directive? No  Health Maintenance Pap/HPV- No Mammogram- No Tetanus-due Hep C screening-unsure HIV screening-unsure  Past Medical History:  Diagnosis Date   No known health problems      Past Surgical History:  Procedure Laterality Date   CESAREAN SECTION  2002   TUBAL LIGATION  2002    Medications  Current Outpatient Medications on File Prior to Visit  Medication Sig Dispense Refill   [DISCONTINUED] fluticasone  (FLONASE ) 50 MCG/ACT nasal spray Place 2 sprays into both nostrils daily. 16 g 0   Allergies Allergies  Allergen Reactions   Other Hives    pineapple   Penicillins Hives and Swelling    Review of Systems: Constitutional:  no unexpected weight changes Eye:  no recent significant change in vision Ear/Nose/Mouth/Throat:  Ears:  no recent change in hearing Nose/Mouth/Throat:  no complaints of nasal congestion, no sore throat Cardiovascular: no chest pain Respiratory:  no shortness of breath Gastrointestinal:  no abdominal pain, no change in bowel habits GU:  Female: negative for dysuria or pelvic pain Musculoskeletal/Extremities:  no pain of the joints Integumentary (Skin/Breast):  no abnormal skin lesions reported Neurologic:  no headaches Endocrine:  denies fatigue Hematologic/Lymphatic:  No areas of easy bleeding  Exam BP 136/86 (BP Location: Left Arm, Patient Position: Sitting)   Pulse 83   Temp 98 F (36.7 C) (Oral)   Resp 16   Ht 5' 4 (1.626 m)   Wt 211 lb 3.2 oz (95.8 kg)   SpO2 100%   BMI 36.25 kg/m  General:  well developed, well nourished, in no apparent distress Skin:  no significant moles, warts, or growths Head:  no  masses, lesions, or tenderness Eyes:  pupils equal and round, sclera anicteric without injection Ears:  canals without lesions, TMs shiny without retraction, no obvious effusion, no erythema Nose:  nares patent, mucosa normal, and no drainage Throat/Pharynx:  lips and gingiva without lesion; tongue and uvula midline; non-inflamed pharynx; no exudates or postnasal drainage Neck: neck supple without adenopathy, thyromegaly, or masses Lungs:  clear to auscultation, breath sounds equal bilaterally, no respiratory distress Cardio:  regular rate and rhythm, no LE edema Abdomen:  abdomen soft, nontender; bowel sounds normal; no masses or organomegaly Genital: Defer to GYN Musculoskeletal:  symmetrical muscle groups noted without atrophy or deformity Extremities:  no clubbing, cyanosis, or edema, no deformities, no skin discoloration Neuro:  gait normal; deep tendon reflexes normal and symmetric Psych: well oriented with normal range of affect and appropriate judgment/insight  Assessment and Plan  Well adult exam - Plan: CBC, Comprehensive metabolic panel with GFR, Lipid panel, Hepatitis B surface antibody,quantitative  Encounter for screening mammogram for malignant neoplasm of breast - Plan: MM DIGITAL SCREENING BILATERAL  Encounter for hepatitis C screening test for low risk patient - Plan: Hepatitis C antibody  Screening for HIV without presence of risk factors - Plan: HIV Antibody (routine testing w rflx)  Screen for colon cancer - Plan: Ambulatory referral to Gastroenterology  Need for Tdap vaccination - Plan: Tdap vaccine greater than or equal to 7yo IM   Well 48 y.o. female. Counseled on diet and exercise. Advanced directive form provided  today.  Flu shot politely declined.  Tdap today. Try to get 7-9 hours of sleep nightly. CCS: Refer to GI.  Mammogram also ordered. Screen hep B/C, HIV Other orders as above. Follow up in 1 year. The patient voiced understanding and agreement to  the plan.  Mabel [place], DO 09/13/24 12:01 PM

## 2024-09-13 NOTE — Patient Instructions (Addendum)
 Give us  2-3 business days to get the results of your labs back.   Keep the diet clean and stay active.  Please consider adding some weight resistance exercise to your routine. Consider yoga as well.   Try to get 7-9 hours of sleep nightly.   Please get me a copy of your advanced directive form at your convenience.   If you do not hear anything about your referral in the next 1-2 weeks, call our office and ask for an update.  Let us  know if you need anything.

## 2024-09-14 LAB — HEPATITIS C ANTIBODY: Hepatitis C Ab: NONREACTIVE

## 2024-09-14 LAB — HIV ANTIBODY (ROUTINE TESTING W REFLEX)
HIV 1&2 Ab, 4th Generation: NONREACTIVE
HIV FINAL INTERPRETATION: NEGATIVE

## 2024-09-14 LAB — HEPATITIS B SURFACE ANTIBODY, QUANTITATIVE: Hep B S AB Quant (Post): <5 m[IU]/mL — ABNORMAL LOW (ref >=10)

## 2024-11-14 ENCOUNTER — Encounter: Payer: Self-pay | Admitting: Family Medicine

## 2024-11-14 ENCOUNTER — Encounter: Payer: Self-pay | Admitting: Gastroenterology

## 2024-11-16 ENCOUNTER — Ambulatory Visit: Admitting: Family Medicine

## 2024-11-16 ENCOUNTER — Other Ambulatory Visit: Payer: Self-pay | Admitting: Family Medicine

## 2024-11-16 ENCOUNTER — Encounter: Payer: Self-pay | Admitting: Family Medicine

## 2024-11-16 VITALS — BP 134/82 | HR 86 | Temp 98.0°F | Resp 16 | Ht 64.0 in | Wt 219.2 lb

## 2024-11-16 DIAGNOSIS — Z23 Encounter for immunization: Secondary | ICD-10-CM

## 2024-11-16 DIAGNOSIS — E669 Obesity, unspecified: Secondary | ICD-10-CM

## 2024-11-16 DIAGNOSIS — Z1231 Encounter for screening mammogram for malignant neoplasm of breast: Secondary | ICD-10-CM

## 2024-11-16 DIAGNOSIS — H9201 Otalgia, right ear: Secondary | ICD-10-CM

## 2024-11-16 MED ORDER — CYCLOBENZAPRINE HCL 7.5 MG PO TABS: 7.5000 mg | ORAL_TABLET | Freq: Three times a day (TID) | ORAL | 5 refills | Status: DC | PRN

## 2024-11-16 MED ORDER — ZEPBOUND 7.5 MG/0.5ML ~~LOC~~ SOAJ: 7.5000 mg | SUBCUTANEOUS | 0 refills | Status: AC

## 2024-11-16 MED ORDER — ZEPBOUND 2.5 MG/0.5ML ~~LOC~~ SOAJ: 2.5000 mg | SUBCUTANEOUS | 0 refills | Status: AC

## 2024-11-16 MED ORDER — ZEPBOUND 5 MG/0.5ML ~~LOC~~ SOAJ: 5.0000 mg | SUBCUTANEOUS | 0 refills | Status: AC

## 2024-11-16 NOTE — Progress Notes (Signed)
 Chief Complaint  Patient presents with   Weight Loss    Weight Loss    Subjective: Patient is a 49 y.o. female here for discussion of weight loss.  Patient is a trouble losing weight.  She eats relatively healthy and exercises routinely.  She will walk on the treadmill both flat and on an incline.  She does drink training.  She tries to do this around 3 times per week.  She has never been through supervised weight loss program.  She has tried phentermine in the past which did not work.  She has never been on any other medication.  She does not have diabetes.  Over the past 3 days, she has had right ear pain.  She has associated right jaw pain.  She has been chewing gum.  No trauma, drainage from the ear, fevers, decreased hearing.  No runny or stuffy nose.  Past Medical History:  Diagnosis Date   No known health problems     Objective: BP 134/82 (BP Location: Left Arm, Patient Position: Sitting)   Pulse 86   Temp 98 F (36.7 C) (Oral)   Resp 16   Ht 5' 4 (1.626 m)   Wt 219 lb 3.2 oz (99.4 kg)   SpO2 97%   BMI 37.63 kg/m  General: Awake, appears stated age Heart: RRR, no LE edema MSK: No TTP over the TMJ bilaterally, jaw deviation to the right without clicking/popping. Ears: Patent, no otorrhea, TMs negative bilaterally Lungs: CTAB, no rales, wheezes or rhonchi. No accessory muscle use Psych: Age appropriate judgment and insight, normal affect and mood  Assessment and Plan: Obesity (BMI 30-39.9) - Plan: tirzepatide  (ZEPBOUND ) 2.5 MG/0.5ML Pen, tirzepatide  (ZEPBOUND ) 5 MG/0.5ML Pen, tirzepatide  (ZEPBOUND ) 7.5 MG/0.5ML Pen  Right ear pain  Need for hepatitis B vaccination - Plan: Heplisav-B (HepB-CPG) Vaccine  Chronic, not controlled.  Start Zepbound  2.5 mg weekly and titrate up.  Counseled on diet and exercise.  She will let me know if there are cost issues.  Follow-up in 12 weeks if she is able to get on this. 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  Stretches and exercises provided.  Avoid  chewy foods.  Could consider PT. Hep B vaccine 1/2 today.  Next/final vaccine in 1 month. The patient voiced understanding and agreement to the plan.  Mabel [place], DO 11/16/24  3:01 PM

## 2024-11-16 NOTE — Patient Instructions (Addendum)
 Stay active.  Let us  know if there are cost issues. You may have to contact your insurance company to see what weight loss meds they will cover.   OK to take Tylenol  1000 mg (2 extra strength tabs) or 975 mg (3 regular strength tabs) every 6 hours as needed.  Heat (pad or rice pillow in microwave) over affected area, 10-15 minutes twice daily.   Ice/cold pack over area for 10-15 min twice daily.  Let us  know if you need anything.

## 2024-11-17 ENCOUNTER — Other Ambulatory Visit: Payer: Self-pay | Admitting: Family Medicine

## 2024-11-17 MED ORDER — CYCLOBENZAPRINE HCL 5 MG PO TABS: 5.0000 mg | ORAL_TABLET | Freq: Three times a day (TID) | ORAL | 1 refills | Status: AC | PRN

## 2024-11-18 ENCOUNTER — Inpatient Hospital Stay: Admission: RE | Admit: 2024-11-18

## 2024-11-18 DIAGNOSIS — Z1231 Encounter for screening mammogram for malignant neoplasm of breast: Secondary | ICD-10-CM

## 2024-12-16 ENCOUNTER — Ambulatory Visit

## 2025-09-18 ENCOUNTER — Encounter: Payer: Self-pay | Admitting: Family Medicine
